# Patient Record
Sex: Male | Born: 1955 | Race: White | Hispanic: No | Marital: Married | Smoking: Never smoker
Health system: Southern US, Community
[De-identification: ages and names within clinical notes are randomized; demographics above are authoritative.]

## PROBLEM LIST (undated history)

## (undated) DIAGNOSIS — I10 Essential (primary) hypertension: Secondary | ICD-10-CM

---

## 2010-09-25 ENCOUNTER — Ambulatory Visit: Payer: Self-pay | Admitting: Family Medicine

## 2010-09-29 ENCOUNTER — Emergency Department: Payer: Self-pay | Admitting: Emergency Medicine

## 2012-01-07 LAB — FECAL OCCULT BLOOD, GUAIAC: Fecal Occult Blood: NEGATIVE

## 2012-03-03 IMAGING — CT CT STONE STUDY
1 of 2 series · 15 of 32 positions shown, 19 images · non-contrast
Comparison: none

REASON FOR EXAM: Right flank pain
COMMENTS:

[Series 2: stone · axial · 0.80mm/px · z∈[+60,+524]mm · 15 of 169 slices shown, 19 images]
[im 7/169  soft-tissue]
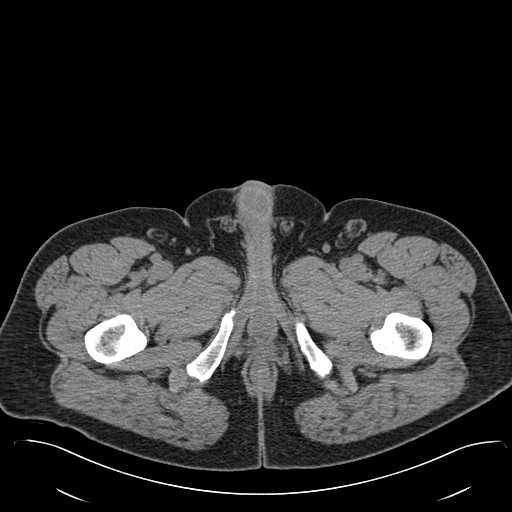
[im 7/169  bone]
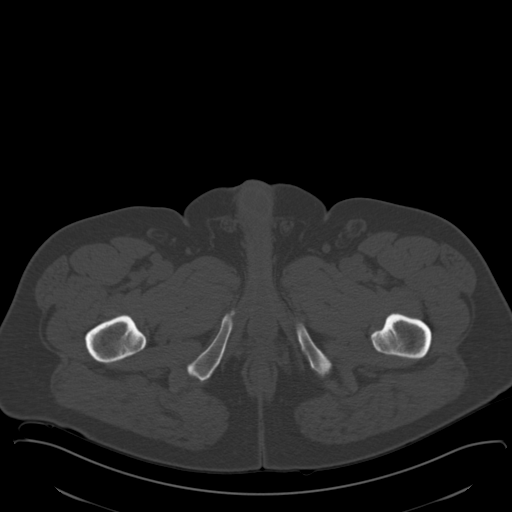
[im 20/169  soft-tissue]
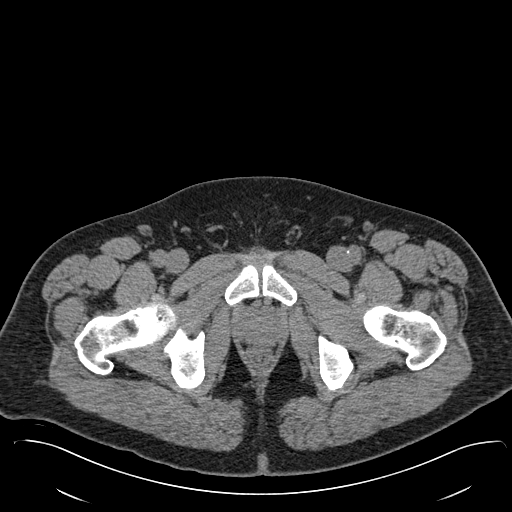
[im 33/169  soft-tissue]
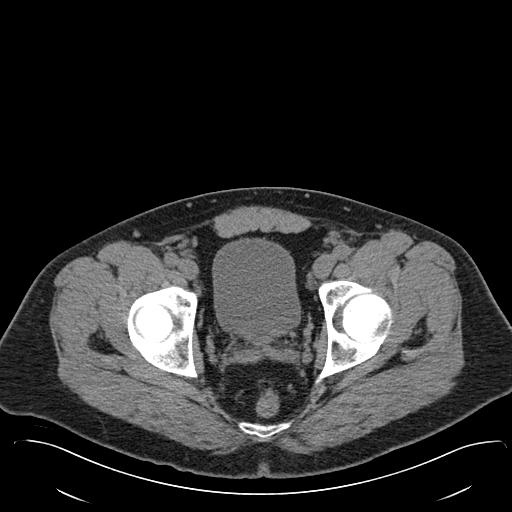
[im 46/169  soft-tissue]
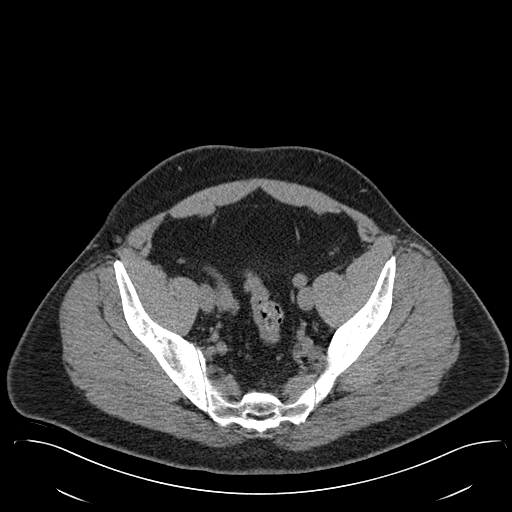
[im 59/169  soft-tissue]
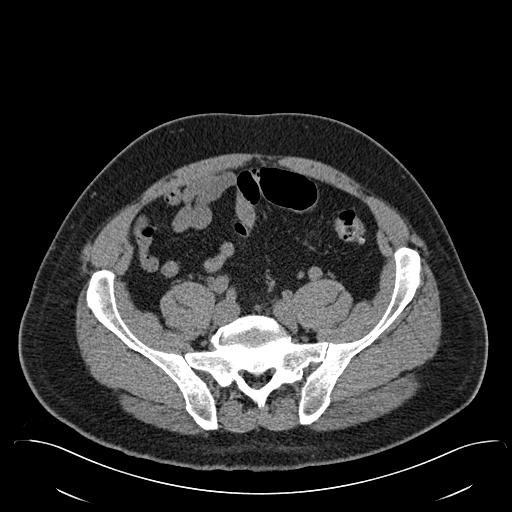
[im 72/169  soft-tissue]
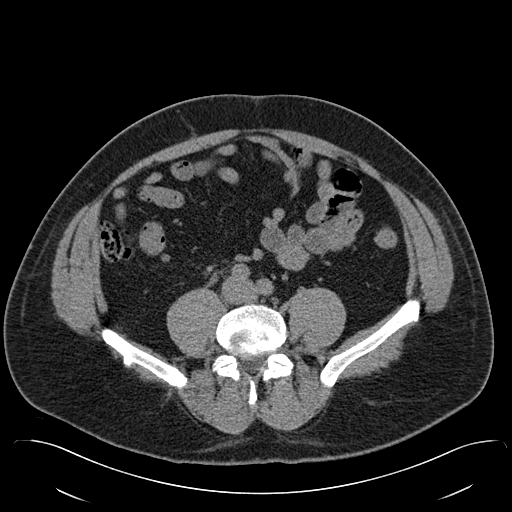
[im 85/169  soft-tissue]
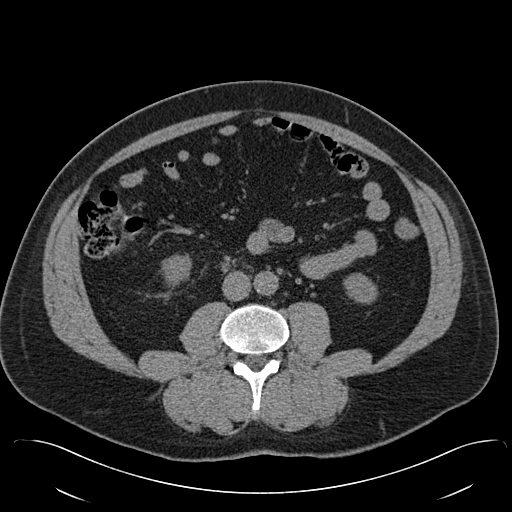
[im 97/169  soft-tissue]
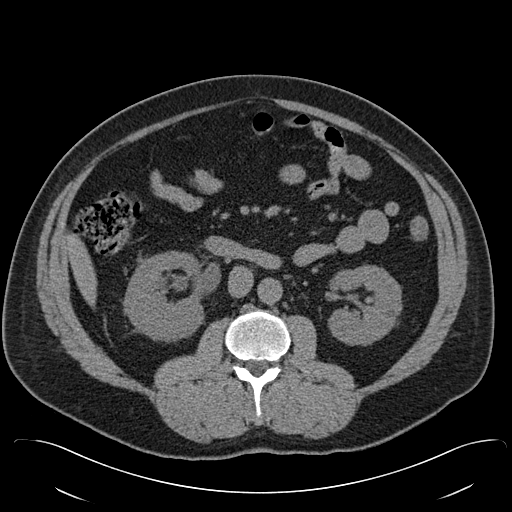
[im 110/169  soft-tissue]
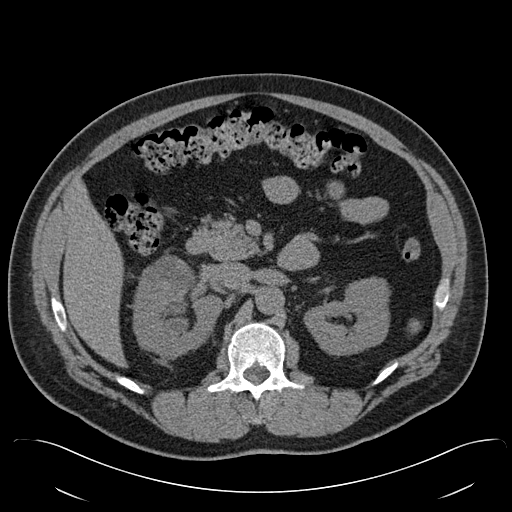
[im 110/169  bone]
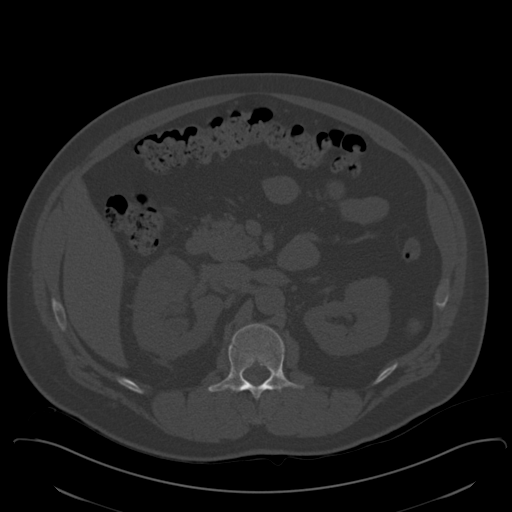
[im 123/169  soft-tissue]
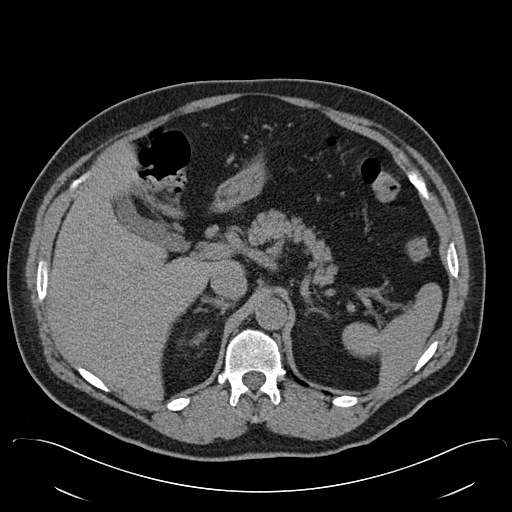
[im 136/169  soft-tissue]
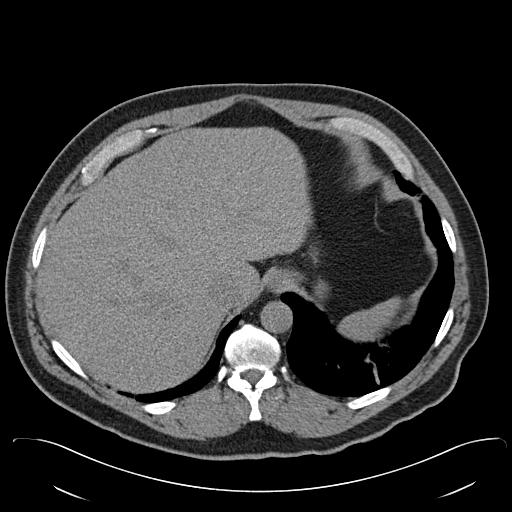
[im 143/169  lung]
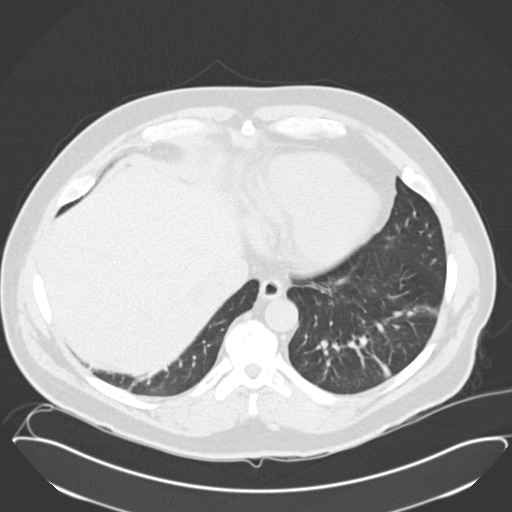
[im 149/169  soft-tissue]
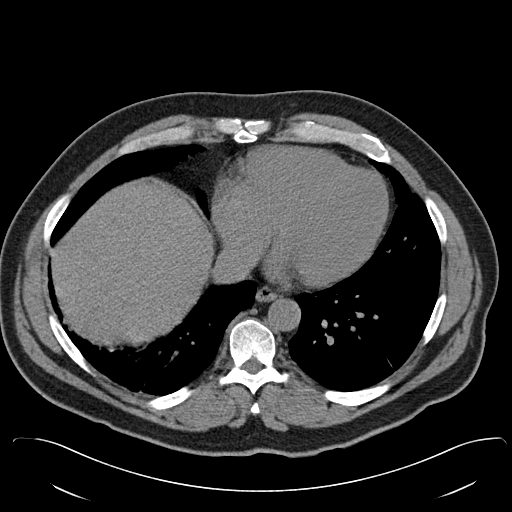
[im 149/169  lung]
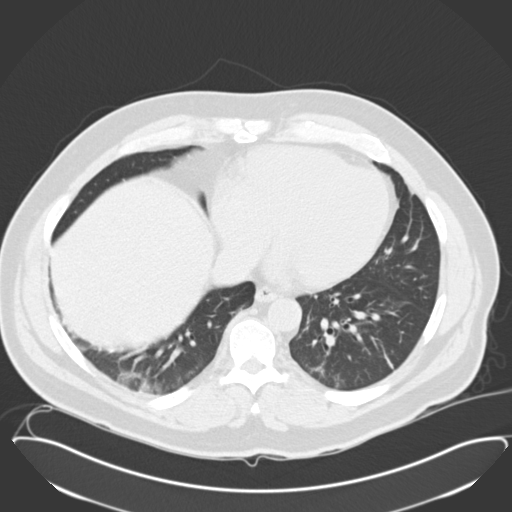
[im 156/169  lung]
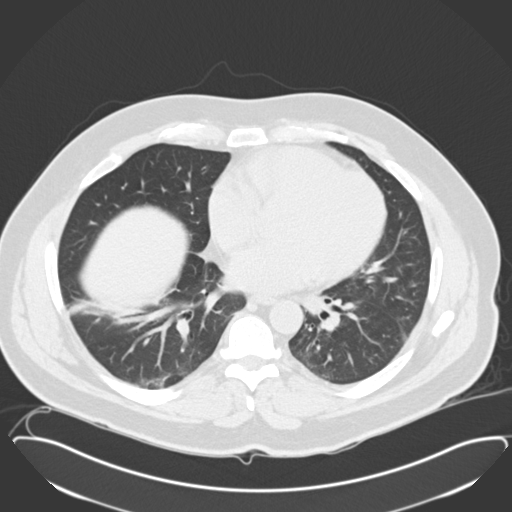
[im 162/169  soft-tissue]
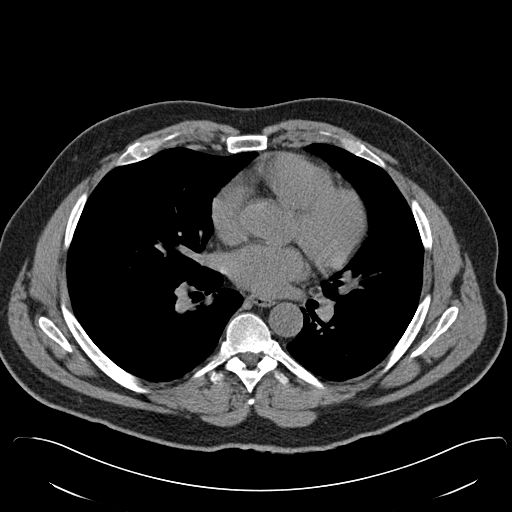
[im 162/169  lung]
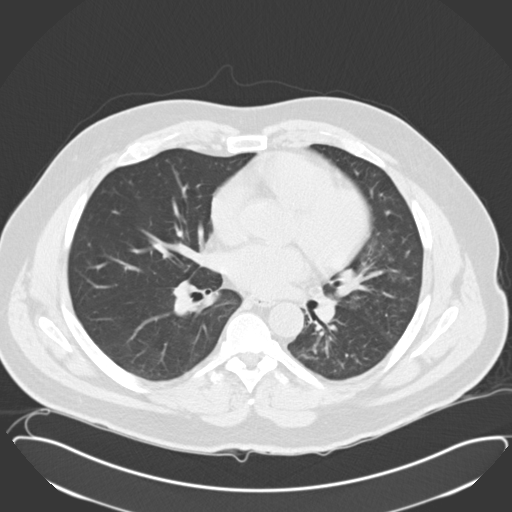

[15 of 32 positions shown; findings below may reference images not displayed]

PROCEDURE:     CT  - CT ABDOMEN /PELVIS WO (STONE)  - September 29, 2010  [DATE]

RESULT:     Axial noncontrast CT scanning was performed through the abdomen
and pelvis at 3 mm intervals and slice thicknesses. Review of multiplanar
reconstructed images was performed separately on the VIA monitor.

There is mild hydronephrosis and hydroureter on the right secondary to an
approximately 2 x 4 mm diameter stone just proximal to the right
ureterovesical junction on image 130. I see no urinary bladder stone. I see
no stones elsewhere in the right kidney nor stones on the left. There are
parapelvic and cortical cystic appearing structures in the right kidney.

The liver exhibits hypodensities in the right lobe most compatible with
cysts. The spleen, pancreas, nondistended stomach, gallbladder, and adrenal
glands are normal in appearance. The caliber of the abdominal aorta is
normal. The unopacified loops of small and large bowel exhibit no evidence
of ileus nor obstruction. The lung bases exhibit patchy increased density
suggesting subsegmental atelectasis versus scarring. The lumbar vertebral
bodies are preserved in height.
IMPRESSION: 1. On the right there is mild hydronephrosis and hydroureter secondary to a
2 x 4 mm diameter stone near the ureterovesical junction. I see no other
stones on the right. There is no obstruction of the left kidney. There are
likely parapelvic and cortical cysts in the right kidney.
2. I see no acute hepatobiliary abnormality nor acute bowel abnormality.

A preliminary report was sent to the [HOSPITAL] the conclusion
of the study.

## 2012-11-28 ENCOUNTER — Ambulatory Visit: Payer: Self-pay | Admitting: Family Medicine

## 2013-09-25 LAB — BASIC METABOLIC PANEL
BUN: 15 mg/dL (ref 4–21)
Creatinine: 0.9 mg/dL (ref 0.6–1.3)
Glucose: 88 mg/dL
Potassium: 3.9 mmol/L (ref 3.4–5.3)
SODIUM: 144 mmol/L (ref 137–147)

## 2013-09-25 LAB — HEPATIC FUNCTION PANEL
ALT: 23 U/L (ref 10–40)
AST: 22 U/L (ref 14–40)
Alkaline Phosphatase: 82 U/L (ref 25–125)
Bilirubin, Total: 0.4 mg/dL

## 2013-09-25 LAB — CBC AND DIFFERENTIAL
HCT: 45 % (ref 41–53)
Hemoglobin: 15.3 g/dL (ref 13.5–17.5)
Platelets: 283 10*3/uL (ref 150–399)
WBC: 6.9 10*3/mL

## 2013-09-25 LAB — TSH: TSH: 1.87 u[IU]/mL (ref 0.41–5.90)

## 2013-09-25 LAB — PSA: PSA: 1

## 2013-09-25 LAB — LIPID PANEL
Cholesterol: 178 mg/dL (ref 0–200)
HDL: 88 mg/dL — AB (ref 35–70)
LDL Cholesterol: 106 mg/dL
Triglycerides: 142 mg/dL (ref 40–160)

## 2013-09-26 ENCOUNTER — Ambulatory Visit: Payer: Self-pay | Admitting: Physician Assistant

## 2013-09-26 LAB — DOT URINE DIP
GLUCOSE, UR: NEGATIVE mg/dL (ref 0–75)
Protein: NEGATIVE
SPECIFIC GRAVITY: 1.01 (ref 1.003–1.030)

## 2014-05-03 IMAGING — CR DG KNEE COMPLETE 4+V*L*
1 series · 5 of 5 positions shown · non-contrast
Comparison: none

REASON FOR EXAM: Fax results [id number] knee pain
COMMENTS:

[Series 1: ap · 0.17mm/px · 5 of 5 slices shown]
[im 1/5]
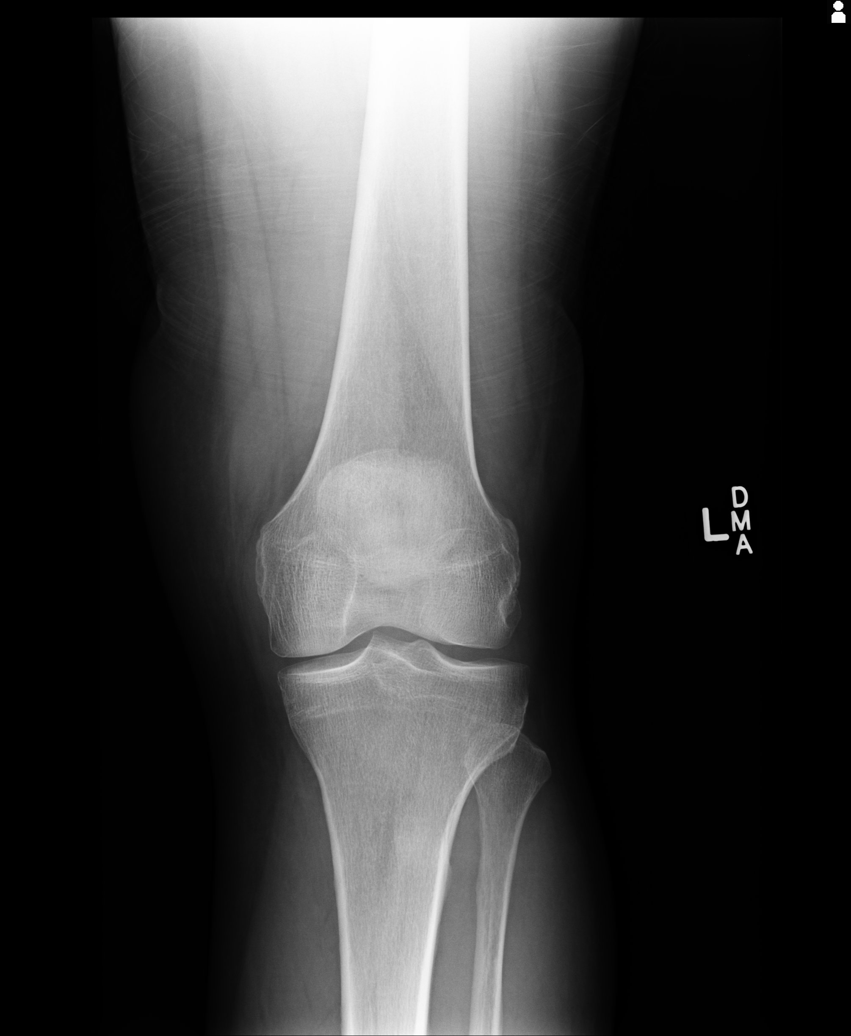
[im 2/5]
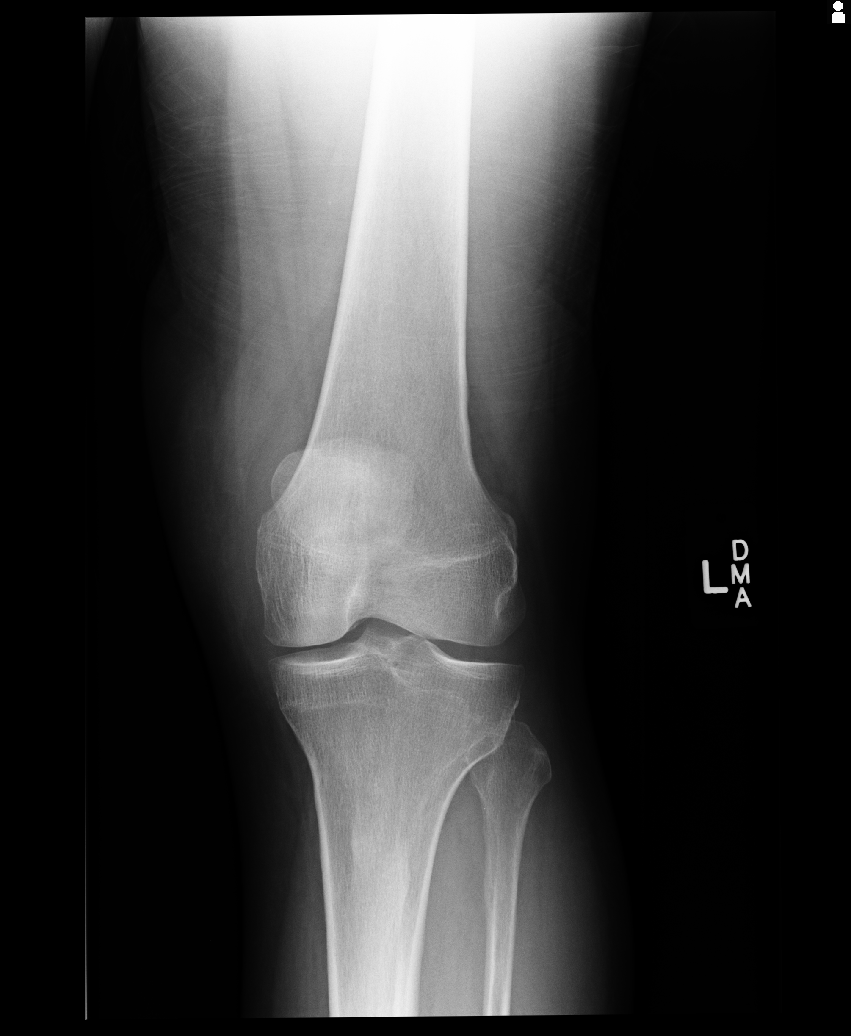
[im 3/5]
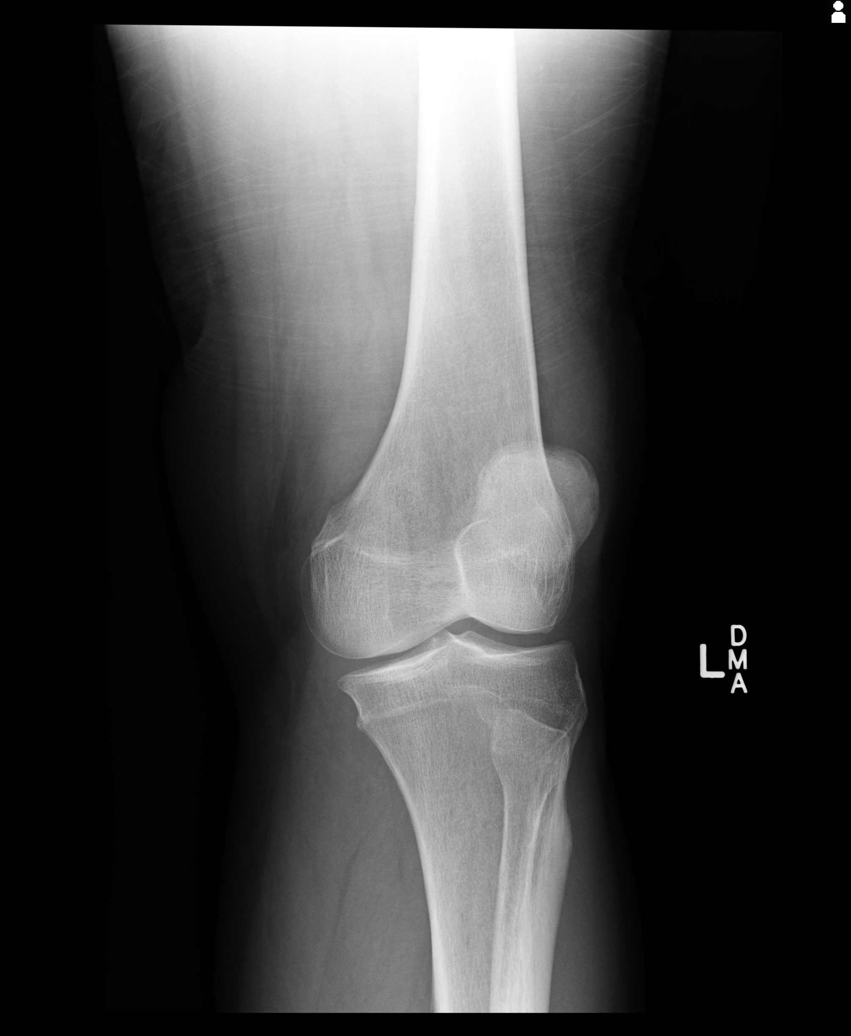
[im 4/5]
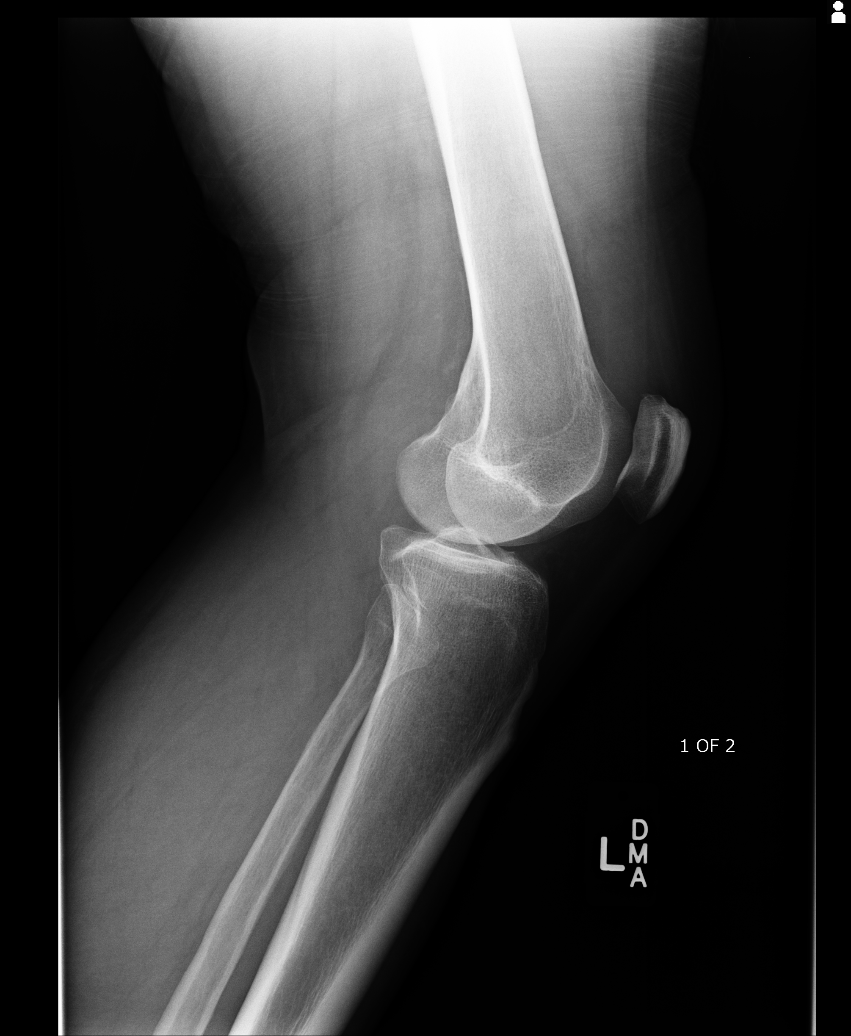
[im 5/5]
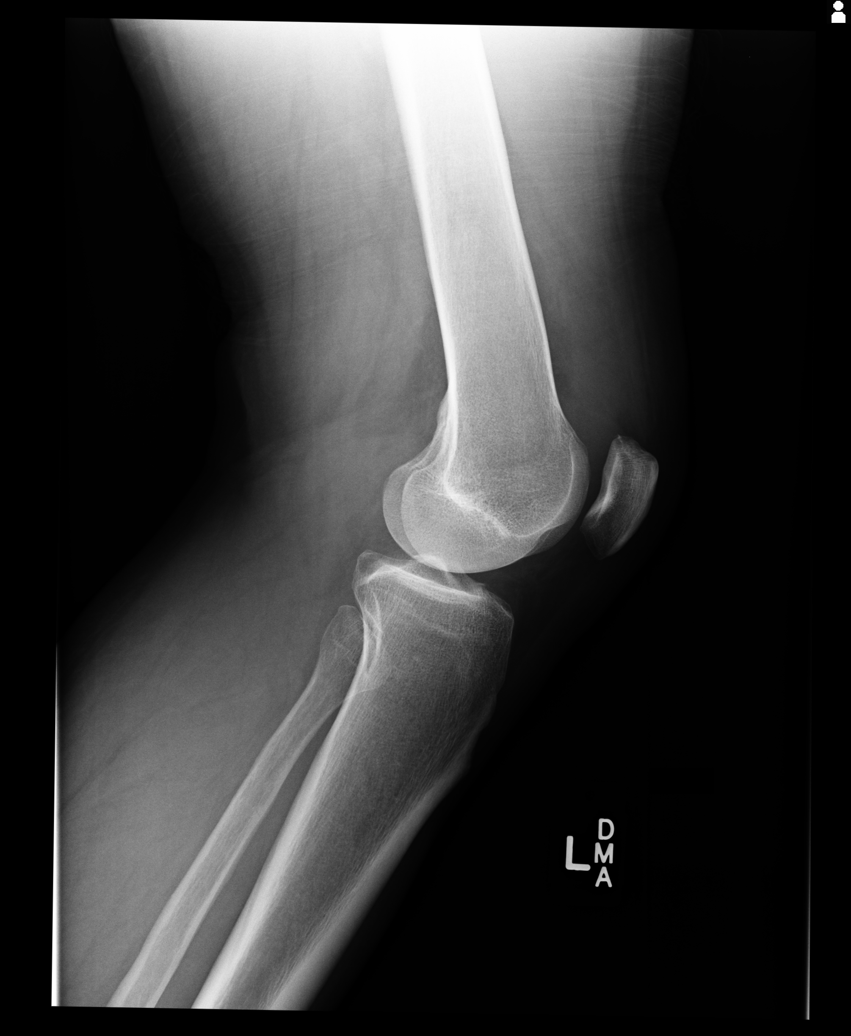

[5 of 5 positions shown; findings below may reference images not displayed]

PROCEDURE:     KDR - KDXR KNEE LT COMP WITH OBLIQUES  - November 28, 2012 [DATE]

RESULT:     Five views of the left knee reveal the bones to be adequately
mineralized. There is no evidence of an acute fracture. No significant
degenerative change is demonstrated. There is no evidence of a joint
effusion.
IMPRESSION: There is no acute bony abnormality of the left knee.

[REDACTED]

## 2014-08-23 DIAGNOSIS — E782 Mixed hyperlipidemia: Secondary | ICD-10-CM | POA: Insufficient documentation

## 2014-08-23 DIAGNOSIS — I10 Essential (primary) hypertension: Secondary | ICD-10-CM | POA: Insufficient documentation

## 2014-09-23 ENCOUNTER — Encounter: Payer: Self-pay | Admitting: Family Medicine

## 2014-09-23 ENCOUNTER — Ambulatory Visit (INDEPENDENT_AMBULATORY_CARE_PROVIDER_SITE_OTHER): Payer: BLUE CROSS/BLUE SHIELD | Admitting: Family Medicine

## 2014-09-23 VITALS — BP 140/80 | HR 62 | Temp 98.4°F | Resp 16 | Ht 69.0 in | Wt 232.4 lb

## 2014-09-23 DIAGNOSIS — Z0000 Encounter for general adult medical examination without abnormal findings: Secondary | ICD-10-CM

## 2014-09-23 DIAGNOSIS — I10 Essential (primary) hypertension: Secondary | ICD-10-CM

## 2014-09-23 DIAGNOSIS — E7800 Pure hypercholesterolemia, unspecified: Secondary | ICD-10-CM

## 2014-09-23 NOTE — Progress Notes (Signed)
Subjective:    Patient ID: Randy Ingram, male    DOB: Jan 25, 1956, 59 y.o.   MRN: [id number]  HPI Chief Complaint  Patient presents with  . Annual Exam   Allergies  Allergen Reactions  . Lidocaine     Other reaction(s): Dizzyness, Unconsciousness   History reviewed. No pertinent past medical history.  reports that he has never smoked. He does not have any smokeless tobacco history on file. He reports that he does not drink alcohol or use illicit drugs.    Family History  Problem Relation Age of Onset  . Cancer Father   . Hypertension Sister   . Stroke Brother   . Heart disease Maternal Grandfather   . Parkinson's disease Brother    Current Outpatient Prescriptions on File Prior to Visit  Medication Sig Dispense Refill  . amLODipine (NORVASC) 10 MG tablet Take 1 tablet by mouth daily.    Marland Kitchen aspirin 81 MG tablet 1 tablet daily.    . B COMPLEX VITAMINS PO 1 tablet daily.    . hydrochlorothiazide (HYDRODIURIL) 25 MG tablet Take 1 tablet by mouth daily.    Marland Kitchen losartan (COZAAR) 50 MG tablet Take 2 tablets by mouth daily.    . OMEGA-3 FATTY ACIDS PO Take 2 tablets by mouth daily.    . psyllium (REGULOID) 0.52 G capsule Take 10 capsules by mouth daily.     No current facility-administered medications on file prior to visit.   History reviewed. No pertinent past surgical history. History of renal stones - spontaneously passed 2004 and 2012.  Past Medical History  Diagnosis Date  . Hypertension       Review of Systems  Constitutional: Negative.   HENT: Negative.   Eyes: Negative.   Respiratory: Negative.   Cardiovascular: Negative.   Gastrointestinal: Negative.   Genitourinary: Negative.   Musculoskeletal: Negative.   Skin: Negative.   Neurological: Negative.   Hematological: Negative.        Objective:   Physical Exam  Constitutional: He is oriented to person, place, and time. He appears well-developed and well-nourished.  HENT:  Head: Normocephalic.   Right Ear: External ear normal.  Left Ear: External ear normal.  Nose: Nose normal.  Mouth/Throat: Oropharynx is clear and moist.  Eyes: Conjunctivae and EOM are normal. Pupils are equal, round, and reactive to light.  Neck: Normal range of motion. Neck supple.  Cardiovascular: Normal rate, regular rhythm, normal heart sounds and intact distal pulses.   Pulmonary/Chest: Effort normal and breath sounds normal.  Abdominal: Soft.  Genitourinary: Rectum normal, prostate normal and penis normal. Guaiac negative stool.  Musculoskeletal: Normal range of motion.  Neurological: He is alert and oriented to person, place, and time. He has normal reflexes.  Skin: Skin is warm and dry.  Psychiatric: He has a normal mood and affect. His behavior is normal.   BP 148/84 mmHg  Pulse 62  Temp(Src) 98.4 F (36.9 C) (Oral)  Resp 16  Ht 5\' 9"  (1.753 m)  Wt 232 lb 6.4 oz (105.416 kg)  BMI 34.30 kg/m2   BP: 140/80 mmHg        Assessment & Plan:  1. Annual physical exam Good general health. Tdap and pneumonia vaccinations up to date. States he was given Shingles vaccination in the past couple years and he will have the [facility] send documentation. Given anticipatory guidance appropriate for age. Recommend scheduling screening colonoscopy.   - PSA  2. Essential hypertension Tolerating Losartan, HCTZ and Amlodipine without side  effects. BP reported as 130-120/80-90 at home. Always elevates in a medical office. Today's reading in normal range. Continue present regimen. EKG wnl. Will recheck routine labs. - EKG 12-Lead EKG: normal EKG, normal sinus rhythm, unchanged from previous tracings, normal sinus rhythm.  - CBC with Differential - COMPLETE METABOLIC PANEL WITH GFR  3. Pure hypercholesterolemia Trying to follow low fat diet. Still needs to lose weight. Taking Omega-3 supplement and Psyllium to help lower cholesterol. Will recheck labs to assess progress. Recheck pending report. - COMPLETE  METABOLIC PANEL WITH GFR - TSH - Lipid Profile

## 2014-09-25 LAB — CBC WITH DIFFERENTIAL/PLATELET
BASOS ABS: 0 10*3/uL (ref 0.0–0.2)
BASOS: 1 %
EOS (ABSOLUTE): 0.2 10*3/uL (ref 0.0–0.4)
Eos: 2 %
HEMATOCRIT: 44.9 % (ref 37.5–51.0)
HEMOGLOBIN: 15.5 g/dL (ref 12.6–17.7)
Immature Grans (Abs): 0 10*3/uL (ref 0.0–0.1)
Immature Granulocytes: 0 %
Lymphocytes Absolute: 2.3 10*3/uL (ref 0.7–3.1)
Lymphs: 29 %
MCH: 30.5 pg (ref 26.6–33.0)
MCHC: 34.5 g/dL (ref 31.5–35.7)
MCV: 88 fL (ref 79–97)
MONOCYTES: 6 %
MONOS ABS: 0.5 10*3/uL (ref 0.1–0.9)
Neutrophils Absolute: 5 10*3/uL (ref 1.4–7.0)
Neutrophils: 62 %
Platelets: 308 10*3/uL (ref 150–379)
RBC: 5.09 x10E6/uL (ref 4.14–5.80)
RDW: 13.7 % (ref 12.3–15.4)
WBC: 7.9 10*3/uL (ref 3.4–10.8)

## 2014-09-25 LAB — PSA: Prostate Specific Ag, Serum: 1.2 ng/mL (ref 0.0–4.0)

## 2014-09-25 LAB — LIPID PANEL
CHOLESTEROL TOTAL: 188 mg/dL (ref 100–199)
Chol/HDL Ratio: 4.2 ratio units (ref 0.0–5.0)
HDL: 45 mg/dL (ref >39)
LDL Calculated: 112 mg/dL — ABNORMAL HIGH (ref 0–99)
TRIGLYCERIDES: 155 mg/dL — AB (ref 0–149)
VLDL Cholesterol Cal: 31 mg/dL (ref 5–40)

## 2014-09-25 LAB — TSH: TSH: 2.48 u[IU]/mL (ref 0.450–4.500)

## 2014-09-26 ENCOUNTER — Telehealth: Payer: Self-pay

## 2014-09-26 ENCOUNTER — Ambulatory Visit
Admission: EM | Admit: 2014-09-26 | Discharge: 2014-09-26 | Disposition: A | Discharge status: Home / Self Care | Payer: PRIVATE HEALTH INSURANCE | Attending: Family Medicine | Admitting: Family Medicine

## 2014-09-26 DIAGNOSIS — Z0289 Encounter for other administrative examinations: Secondary | ICD-10-CM

## 2014-09-26 HISTORY — DX: Essential (primary) hypertension: I10

## 2014-09-26 LAB — DEPT OF TRANSP DIPSTICK, URINE (ARMC ONLY)
Glucose, UA: NEGATIVE mg/dL
HGB URINE DIPSTICK: NEGATIVE
Protein, ur: NEGATIVE mg/dL
Specific Gravity, Urine: 1.02 (ref 1.005–1.030)

## 2014-09-26 NOTE — Telephone Encounter (Signed)
Attempted to contact patient. No answer nor voicemail.  

## 2014-09-26 NOTE — Telephone Encounter (Signed)
Patient advised as directed below. Patient verbalized understanding.  

## 2014-09-26 NOTE — ED Provider Notes (Signed)
[facility] Emergency Department Provider Note  ____________________________________________  Time seen: Approximately 11:30 AM  I have reviewed the triage vital signs and the nursing notes.   HISTORY  Chief Complaint Employment Physical    HPI Randy Ingram is a 59 y.o. male presents for DOT physical. See scanned documents for complete details.   Past Medical History  Diagnosis Date  . Hypertension     Patient Active Problem List   Diagnosis Date Noted  . 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 BP (high blood pressure) 08/23/2014  . Essential (primary) hypertension 06/27/2007    History reviewed. No pertinent past surgical history.  Current Outpatient Rx  Name  Route  Sig  Dispense  Refill  . amLODipine (NORVASC) 10 MG tablet   Oral   Take 1 tablet by mouth daily.         . APPLE CIDER VINEGAR PO   Oral   Take by mouth daily. 2 tablespoons daily         . aspirin 81 MG tablet      1 tablet daily.         . B COMPLEX VITAMINS PO      1 tablet daily.         . hydrochlorothiazide (HYDRODIURIL) 25 MG tablet   Oral   Take 1 tablet by mouth daily.         Marland Kitchen losartan (COZAAR) 50 MG tablet   Oral   Take 2 tablets by mouth daily.         . OMEGA-3 FATTY ACIDS PO   Oral   Take 2 tablets by mouth daily.         . psyllium (REGULOID) 0.52 G capsule   Oral   Take 10 capsules by mouth daily.           Allergies Lidocaine  Family History  Problem Relation Age of Onset  . Cancer Father   . Hypertension Sister   . Stroke Brother   . Heart disease Maternal Grandfather   . Parkinson's disease Brother     Social History History  Substance Use Topics  . Smoking status: Never Smoker   . Smokeless tobacco: Not on file  . Alcohol Use: No    Review of Systems Constitutional: No fever/chills Eyes: No visual changes. ENT: No sore throat. Cardiovascular: Denies chest  pain. Respiratory: Denies shortness of breath. Gastrointestinal: No abdominal pain.  No nausea, no vomiting.  No diarrhea.  No constipation. Genitourinary: Negative for dysuria. Musculoskeletal: Negative for back pain. Skin: Negative for rash. Neurological: Negative for headaches, focal weakness or numbness.  10-point ROS otherwise negative.  ____________________________________________   PHYSICAL EXAM:  VITAL SIGNS: ED Triage Vitals  Enc Vitals Group     BP 09/26/14 1111 168/82 mmHg     Pulse Rate 09/26/14 1110 70     Resp 09/26/14 1110 16     Temp 09/26/14 1110 98.6 F (37 C)     Temp Source 09/26/14 1110 Oral     SpO2 09/26/14 1110 98 %     Weight 09/26/14 1111 230 lb (104.327 kg)     Height 09/26/14 1111 5\' 9"  (1.753 m)     Head Cir --      Peak Flow --      Pain Score --      Pain Loc --      Pain Edu? --      Excl. in [facility]? --  Constitutional: Alert and oriented. Well appearing and in no acute distress. Eyes: Conjunctivae are normal. PERRL. EOMI. Head: Atraumatic. Nose: No congestion/rhinnorhea. Mouth/Throat: Mucous membranes are moist.  Oropharynx non-erythematous. Neck: No stridor.   Cardiovascular: Normal rate, regular rhythm. Grossly normal heart sounds.  Good peripheral circulation. Respiratory: Normal respiratory effort.  No retractions. Lungs CTAB. Gastrointestinal: Soft and nontender. No distention. No abdominal bruits. No CVA tenderness. Musculoskeletal: No lower extremity tenderness nor edema.  No joint effusions. Neurologic:  Normal speech and language. No gross focal neurologic deficits are appreciated. Speech is normal. No gait instability. Skin:  Skin is warm, dry and intact. No rash noted. Psychiatric: Mood and affect are normal. Speech and behavior are normal.  ____________________________________________   LABS (all labs ordered are listed, but only abnormal results are displayed)  Labs Reviewed  DEPT OF TRANSP DIPSTICK, URINE([facility]  ONLY)   ____________________________________________  EKG  Deferred ____________________________________________  RADIOLOGY  Deferred ____________________________________________   PROCEDURES  Procedure(s) performed: None  Critical Care performed: No  ____________________________________________   INITIAL IMPRESSION / ASSESSMENT AND PLAN / ED COURSE  Pertinent labs & imaging results that were available during my care of the patient were reviewed by me and considered in my medical decision making (see chart for details).  DOT physical. ____________________________________________   FINAL CLINICAL IMPRESSION(S) / ED DIAGNOSES  Final diagnoses:  Encounter for examination required by Department of Transportation (DOT)      Arlyss Repress, PA-C 09/26/14 1301

## 2014-09-26 NOTE — Telephone Encounter (Signed)
-----   Message from Margo Common, [place] sent at 09/26/2014  8:26 AM EDT ----- Blood tests essentially normal except triglycerides and LDL cholesterol slightly up. Continue present regimen of Psyllium and Omega-3 Fish Oil with low fat diet. Losing 5-10 lbs would also help. Recheck appointment in 6 months to check on progress.

## 2014-09-26 NOTE — ED Notes (Signed)
For DOT Physical.

## 2014-12-05 ENCOUNTER — Telehealth: Payer: Self-pay | Admitting: Family Medicine

## 2014-12-05 DIAGNOSIS — Z1211 Encounter for screening for malignant neoplasm of colon: Secondary | ICD-10-CM

## 2014-12-05 NOTE — Telephone Encounter (Signed)
Pt states she was here in June and was told he needed a colonoscopy but has not had a call back with an appt.  CB#[phone]/MW

## 2014-12-05 NOTE — Telephone Encounter (Signed)
Schedule for screening colonoscopy.

## 2014-12-05 NOTE — Telephone Encounter (Signed)
Please advise 

## 2014-12-09 ENCOUNTER — Telehealth: Payer: Self-pay

## 2014-12-09 DIAGNOSIS — I10 Essential (primary) hypertension: Secondary | ICD-10-CM

## 2014-12-09 MED ORDER — LOSARTAN POTASSIUM 100 MG PO TABS: 100.0000 mg | ORAL_TABLET | Freq: Every day | ORAL | Status: DC

## 2014-12-09 NOTE — Telephone Encounter (Signed)
Randy Ingram patient--- Refill requested received from [facility] requesting Losartan 100 mg. Per pharmacy patient was taking 2 50 mg tabs per day, but it is no longer covered by insurance for 2 tablets per day. Please authorize change to 100 mg tablets

## 2014-12-09 NOTE — Telephone Encounter (Signed)
[facility]  Advanced Heart Failure and Transplant  Heart Transplant Clinic  Follow-up Visit    Primary Care Physician: Brodsky, Mark E  Referring Provider: Brett Justin Berman  Date of Transplant: 06/08/2019  Organ(s) Transplanted: heart  Indication for transplant: Dilated Myopathy: Idiopathic  PHS increased risk donor: Yes    ID. 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    Interval History:    The patient was last seen on 08/03/21. At that time issues with pain after urologic procedure.    He continues to deal with pain issue largely from prostate surgery. He still has some bleeding and some tissue come out. He tried different strategies and nothing helped. This is really impacting quality of life. He gets tired and frustrated and does not want to take it out on his family.    ROS:  A complete ROS was performed and is negative except as documented in the HPI.      Allergies:  Patient is allergic to cats [other] and dogs [other].    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    Pericardial effusion    Hypertension    Chronic back pain    At risk for infection transmitted from donor    Acute hepatitis C virus infection    Heart transplanted (CMS-HCC)    Acute UTI    Umbilical hernia without obstruction and without gangrene    COVID-19 virus detected    Acute medial meniscus tear of left knee, sequela    Localized osteoarthritis of left knee     Past Surgical History:   Procedure Laterality Date    CARDIAC DEFIBRILLATOR PLACEMENT       PB ANESTH,SHOULDER JOINT,NOS Right      Family History   Problem Relation Name Age of Onset    Hypertension Other      Other Maternal Grandmother          kidney disease needing HD     Social History     Socioeconomic History    Marital status: Single     Spouse name: Not on file    Number of children: Not on file    Years of education: Not on file    Highest education level: Not on file   Occupational History    Not on file   Tobacco Use    Smoking status: Never    Smokeless tobacco: Never    Tobacco comments:     from friends and relatives    Substance and Sexual Activity    Alcohol use: Not Currently     Comment: Prior heavier use, but completely quit in 2016    Drug use: Yes     Comment: eats edible marijuana for pain and insomnia     Sexual activity: Not on file   Other Topics Concern    Not on file   Social History Narrative      Born in [place], also lived in [place], [place], [place], no travel, worked as a carpenter, occasional cedar, no birds, no hot tubs, worked in construction + possible asbestos exposure      Social Determinants of Health     Financial Resource Strain: Not on file   Food Insecurity: Not on file   Transportation Needs: Not on file   Physical Activity: Not on file   Stress: Not on file   Social Connections: Not on file   Intimate Partner Violence: Not on file   Housing Stability: Not on file     Current Outpatient Medications   Medication Sig    albuterol 108 (90 Base) MCG/ACT inhaler Inhale 2 puffs by mouth every 4 hours as needed for Wheezing or Shortness of Breath.    aspirin 81 MG EC tablet Take 1 tablet (81 mg) by mouth daily.    baclofen (LIORESAL) 10 MG tablet Take 2 tablets (20 mg) by mouth nightly.    Blood Glucose Monitoring Suppl (TRUE METRIX METER) w/Device KIT Use as directed    budesonide-formoterol (SYMBICORT) 160-4.5 MCG/ACT inhaler Inhale 2 puffs by mouth every 12 hours.    bumetanide (BUMEX) 1 MG tablet Take 1 tablet (1 mg) by mouth daily as needed (fluid/weight  gain). Do not take unless instructed by Transplant team.    Calcium Carb-Cholecalciferol 600-10 MG-MCG TABS Take 1 tablet by mouth 2 times daily.    Cetirizine HCl (ZERVIATE) 0.24 % SOLN Place 1 drop into both eyes 2 times daily.    clindamycin (CLEOCIN T) 1 % solution Apply 1 Application. topically 2 times daily. Apply to the red bumps on your face up to two times a day.    controlled substance agreement controlled substance agreement    diclofenac (VOLTAREN) 1 % gel Apply 2 g topically 4 times daily.    docusate sodium (COLACE) 100 MG capsule Take 1 capsule (100 mg) by mouth 2 times daily.    DULoxetine (CYMBALTA) 30 MG CR capsule Take 1 capsule (30 mg) by mouth daily.    famotidine (PEPCID) 20 MG tablet Take 1 tablet (20 mg) by mouth 2 times daily.    fluticasone propionate (FLONASE) 50 MCG/ACT nasal spray Spray 1 spray into each nostril 2 times daily.    gabapentin (NEURONTIN) 300 MG capsule Take 1 capsule (300 mg) by mouth every morning AND 1 capsule (300 mg) daily AND 2 capsules (600 mg) every evening.    hydroCHLOROthiazide (HYDRODIURIL) 25 MG tablet Take 1 tablet (25 mg) by mouth daily.    ketoconazole (NIZORAL) 2 % shampoo Use shampoo daily for dandruff    lidocaine (LIDOCAINE PAIN RELIEF) 4 % patch Apply 1 patch topically every 24 hours. Leave patch on for 12 hours, then remove for 12 hours.    lisinopril (PRINIVIL, ZESTRIL) 10 MG tablet Take 2 tablets (20 mg) by mouth daily.    magnesium oxide (MAG-OX) 400 MG tablet Take 1 tablet by mouth daily    melatonin (GNP MELATONIN MAXIMUM STRENGTH) 5 MG tablet Take 2 tablets (10 mg) by mouth at bedtime.    Multiple Vitamin (MULTIVITAMIN) TABS tablet Take 1 tablet by mouth daily.    naloxone (KLOXXADO) 8 mg/0.1 mL nasal spray Call 911! Tilt head and spray intranasally into one nostril as needed for respiratory depression. If patient does not respond or responds and then relapses, repeat using a new nasal spray every 3 minutes until emergency medical assistance  arrives.    NEEDLE, DISP, 25 G 25G X   1" MISC Use to inject testosterone    NIFEdipine (ADALAT CC) 30 MG Controlled-Release tablet Take 1 tablet (30 mg) by mouth nightly.    ondansetron (ZOFRAN) 8 MG tablet Take 1 tablet (8 mg) by mouth every 8 hours as needed for Nausea/Vomiting.    oxyCODONE (ROXICODONE) 10 MG tablet Take 1 tab every 4 hours as needed for moderate pain and 2 tabs every 4 hours as needed for severe pain. Max 10 tabs per day, 28 day supply    phenazopyridine (PYRIDIUM) 100 MG tablet Take 1 tablet (100 mg) by mouth 3 times daily.    polyethylene glycol (GLYCOLAX) 17 GM/SCOOP powder Mix 17 grams in 4-8 oz of liquide and drink by mouth daily as needed (Constipation).    pravastatin (PRAVACHOL) 40 MG tablet Take 1 tablet (40 mg) by mouth every evening.    senna (SENOKOT) 8.6 MG tablet Take 1 tablet (8.6 mg) by mouth daily.    sirolimus (RAPAMUNE) 1 MG tablet Take 2 tablets (2 mg) by mouth every morning.    SYRINGE-NEEDLE, DISP, 3 ML (B-D 3CC LUER-LOK SYR 25GX1") 25G X 1" 3 ML MISC Use as directed to inject testosterone    SYRINGE-NEEDLE, DISP, 3 ML 18G X 1-1/2" 3 ML MISC Use to draw up testosterone    tacrolimus (ENVARSUS XR) 1 MG tablet STOP TAKING since 12/30/21 - remaining on chart for dose adjustments, titratable med.    tacrolimus (ENVARSUS XR) 4 MG tablet Take 1 tablet (4 mg) by mouth every morning.    tamsulosin (FLOMAX) 0.4 MG capsule Take 1 capsule (0.4 mg) by mouth daily.    tamsulosin (FLOMAX) 0.4 MG capsule Take 1 capsule (0.4 mg) by mouth daily.    testosterone cypionate (DEPO-TESTOSTERONE) 200 MG/ML SOLN Inject 1 ml into the muscle every 14 days    traZODone (DESYREL) 50 MG tablet Take 1 tablet (50 mg) by mouth nightly.     Current Facility-Administered Medications   Medication    diphenhydrAMINE (BENADRYL) injection 50 mg    diphenhydrAMINE (BENADRYL) tablet 50 mg     Immunization History   Administered Date(s) Administered    COVID-19 (Moderna) Low Dose Red Cap >= 18 Years 05/30/2020     COVID-19 (Moderna) Red Cap >= 12 Years 07/07/2019, 08/06/2019, 12/05/2019    Hep-A/Hep-B; Twinrix, Adult 06/30/2020    Influenza Vaccine (High Dose) Quadrivalent >=65 Years 02/20/2020    Influenza Vaccine (Unspecified) 12/18/2016    Influenza Vaccine >=6 Months 03/02/2010, 03/02/2011, 04/27/2012, 01/23/2014, 01/06/2018, 01/22/2019    Pneumococcal 13 Vaccine (PREVNAR-13) 02/20/2020    Pneumococcal 23 Vaccine (PNEUMOVAX-23) 03/02/2013, 06/30/2020    Tdap 04/20/2011   Deferred Date(s) Deferred    Pneumococcal 23 Vaccine (PNEUMOVAX-23) 06/21/2019     Physical Exam:  BP 102/69 (BP Location: Right arm, BP Patient Position: Sitting, BP cuff size: Large)   Pulse 98   Temp 98.5 F (36.9 C) (Temporal)   Resp 16   Ht 5' 10" (1.778 m)   Wt 96.2 kg (212 lb)   SpO2 97%   BMI 30.42 kg/m      General Appearance: ***alert, no distress, pleasant affect, cooperative.  Heart:  JVD ***, PMI ***, normal rate and regular rhythm, no murmurs, clicks, or gallops. ***  Lungs: ***clear to auscultation and percussion. No rales, rhonchi, or wheezes noted. No chest deformities noted.  Abdomen: ***BS normal.  Abdomen soft, non-tender.  No masses or organomegaly.  Extremities:  ***no cyanosis, clubbing, or edema. Has 2+ peripheral pulses.        Lab Data:  Lab Results   Component Value Date    BUN 26 (H) 12/30/2021    CREAT 1.98 (H) 12/30/2021    CL 99 12/30/2021    NA 140 12/30/2021    K 4.4 12/30/2021    CA 9.2 12/30/2021    TBILI 0.47 12/30/2021    ALB 4.1 12/30/2021    TP 7.1 12/30/2021    AST 22 12/30/2021    ALK 76 12/30/2021    BICARB 29 12/30/2021    ALT 25 12/30/2021    GLU 126 (H) 12/30/2021     Lab Results   Component Value Date    WBC 7.9 12/30/2021    RBC 5.50 12/30/2021    HGB 15.2 12/30/2021    HCT 46.5 12/30/2021    MCV 84.5 12/30/2021    MCHC 32.7 12/30/2021    RDW 12.3 12/30/2021    PLT 162 12/30/2021    MPV 11.6 12/30/2021     Lab Results   Component Value Date    A1C 5.7 05/29/2021     Lab Results   Component Value Date     TSH 1.63 05/29/2021     Lab Results   Component Value Date    CHOL 105 05/29/2021    HDL 38 05/29/2021    LDLCALC 43 05/29/2021    TRIG 121 05/29/2021     Lab Results   Component Value Date    SIROT 11.5 12/30/2021     Lab Results   Component Value Date    FKTR 6.5 12/30/2021     No results found for: CSATR  Lab Results   Component Value Date    CMVPL Not Detected 03/20/2021     Lab Results   Component Value Date    DSA ABSENT 12/30/2021       Prior Cardiovascular Studies:   Lab Results   Component Value Date    LV Ejection Fraction 59 06/25/2021          Echo 06/25/21  Summary:   1. The left ventricular size is normal. The left ventricular systolic function is normal.   2. No left ventricular hypertrophy.   3. Normal pattern of left ventricular diastolic filling.   4. EF=59%.   5. Compared to prior study EF now 59%, was 69% 07/18/20.     LHC/IVUS 06/23/21  CONCLUSION:                                                                   1. Myocardial bridging with mild systolic compression of the mid segment    of the left anterior descending coronary artery.                              2. No angiographic evidence of coronary artery disease.                      3. Intimal thickness noted in LAD/LM up to 0.5 mm (Stable to slightly       worse compare to 2022).                                                         4. Non significant FFR at apical LAD.                                        5. Left ventricular end diastolic pressure appears normal.        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    Assessment/Plan:  # Hematuria  # Dysuria  # Chronic pain  Assessment: We had a long frank discussion about patient's chronic pain issues and the heart transplant team's role in this. I discussed with him that when I initially agreed to cover his chronic opiate prescription, this was the assumption that he would  have a provider versed in chronic pain after 3-4 months, but we are at 6 months and has unable to find one. Additionally, I had not put him on a pain contract at that time, but he recently used more opiates without asking and I informed him this was not appropriate, but because he had not established guidelines I was not going to stop at this time. However, going forward until he can establish with a pain physician, we will set up a pain contract and he will need to follow through like a usual pain clinic with us with goal of provider in 3-4 months or I may start tapering. I will augment adjuvant agents additionally for now and we can continue to work on this.  Plan:  -pain contract signed  -urine tox monthly  -clinic follow up month  -oxycodone 10 mg tablets PO, 1 tab every 4 hours moderate pain, 2 tabs every 4 hours for severe pain, no more than 10 tablets a day, total 280 per 28 days.   -diclofenac cream for joint pain  -lidocaine patch for back pain  -trial of pyridium  -increase gaba at night  -siro change as below  -cymbalta as below    # End-stage heart failure s/p orthotopic heart transplant  # Chronic Immunosuppression/Immunomodulation  Assessment: While we thought continuing sirolimus would help prevent recurrent scar tissue from prostate procedure, it may be exacerbating factors now with delayed wound healing. Will try mmf for 1 month.  Plan:   - continue envarsus 6 mg daily, goal trough 4-8  - HOLD sirolimus 3 mg daily, goal trough 4-8 for at least 1 month  - start mmf 1000 mg bid for one month to allow healing  - Continue to monitor for renal toxicities, infection risk and malignancy risk  - continue pravastatin 40 mg daily  - continue aspirin 81 mg daily    # Hypertension  Assessment: controlled  Plan:  -continue lisinopril 20 mg daily  -resume hctz  -nifedipine 30 mg daily    # Dyslipidemia  -continue pravastatin 40 mg daily    # Depression  Assessment: improved mood  Plan:  -increase cymbalta to 120  mg daily     RTC in 1 month       Nicholas W Wettersten, MD  Advanced Heart Failure, Mechanical Circulatory Support, Transplant  Pgr: [phone]

## 2014-12-13 ENCOUNTER — Telehealth: Payer: Self-pay | Admitting: Gastroenterology

## 2014-12-13 ENCOUNTER — Other Ambulatory Visit: Payer: Self-pay

## 2014-12-13 NOTE — Telephone Encounter (Signed)
Gastroenterology Pre-Procedure Review  Request Date: 01-17-2015 Requesting Physician: Dr.   PATIENT REVIEW QUESTIONS: The patient responded to the following health history questions as indicated:    1. Are you having any GI issues? no 2. Do you have a personal history of Polyps? no 3. Do you have a family history of Colon Cancer or Polyps? no 4. Diabetes Mellitus? no 5. Joint replacements in the past 12 months?no 6. Major health problems in the past 3 months?no 7. Any artificial heart valves, MVP, or defibrillator?no    MEDICATIONS & ALLERGIES:    Patient reports the following regarding taking any anticoagulation/antiplatelet therapy:   Plavix, Coumadin, Eliquis, Xarelto, Lovenox, Pradaxa, Brilinta, or Effient? no Aspirin? yes (DAily )  Patient confirms/reports the following medications:  Current Outpatient Prescriptions  Medication Sig Dispense Refill   amLODipine (NORVASC) 10 MG tablet Take 1 tablet by mouth daily.     APPLE CIDER VINEGAR PO Take by mouth daily. 2 tablespoons daily     aspirin 81 MG tablet 1 tablet daily.     B COMPLEX VITAMINS PO 1 tablet daily.     hydrochlorothiazide (HYDRODIURIL) 25 MG tablet Take 1 tablet by mouth daily.     losartan (COZAAR) 100 MG tablet Take 1 tablet (100 mg total) by mouth daily. 30 tablet 6   OMEGA-3 FATTY ACIDS PO Take 2 tablets by mouth daily.     psyllium (REGULOID) 0.52 G capsule Take 10 capsules by mouth daily.     No current facility-administered medications for this visit.    Patient confirms/reports the following allergies:  Allergies  Allergen Reactions   Lidocaine     Other reaction(s): Dizzyness, Unconsciousness    No orders of the defined types were placed in this encounter.    AUTHORIZATION INFORMATION Primary Insurance: 1D#: Group #:  Secondary Insurance: 1D#: Group #:  SCHEDULE INFORMATION: Date: 01-17-2015 Time: Location:[facility]

## 2015-02-27 NOTE — Discharge Instructions (Signed)

## 2015-02-28 ENCOUNTER — Encounter
Admission: RE | Disposition: A | Discharge status: Home / Self Care | Payer: BLUE CROSS/BLUE SHIELD | Source: Ambulatory Visit | Attending: Gastroenterology

## 2015-02-28 ENCOUNTER — Ambulatory Visit: Payer: BLUE CROSS/BLUE SHIELD | Admitting: Anesthesiology

## 2015-02-28 ENCOUNTER — Encounter: Payer: Self-pay | Admitting: *Deleted

## 2015-02-28 ENCOUNTER — Ambulatory Visit
Admission: RE | Admit: 2015-02-28 | Discharge: 2015-02-28 | Disposition: A | Discharge status: Home / Self Care | Payer: BLUE CROSS/BLUE SHIELD | Source: Ambulatory Visit | Attending: Gastroenterology | Admitting: Gastroenterology

## 2015-02-28 ENCOUNTER — Other Ambulatory Visit: Payer: Self-pay | Admitting: Gastroenterology

## 2015-02-28 DIAGNOSIS — D125 Benign neoplasm of sigmoid colon: Secondary | ICD-10-CM | POA: Diagnosis not present

## 2015-02-28 DIAGNOSIS — K635 Polyp of colon: Secondary | ICD-10-CM | POA: Insufficient documentation

## 2015-02-28 DIAGNOSIS — Z79899 Other long term (current) drug therapy: Secondary | ICD-10-CM | POA: Diagnosis not present

## 2015-02-28 DIAGNOSIS — K5730 Diverticulosis of large intestine without perforation or abscess without bleeding: Secondary | ICD-10-CM | POA: Diagnosis not present

## 2015-02-28 DIAGNOSIS — Z823 Family history of stroke: Secondary | ICD-10-CM | POA: Insufficient documentation

## 2015-02-28 DIAGNOSIS — I10 Essential (primary) hypertension: Secondary | ICD-10-CM | POA: Diagnosis not present

## 2015-02-28 DIAGNOSIS — Z7982 Long term (current) use of aspirin: Secondary | ICD-10-CM | POA: Insufficient documentation

## 2015-02-28 DIAGNOSIS — Z809 Family history of malignant neoplasm, unspecified: Secondary | ICD-10-CM | POA: Diagnosis not present

## 2015-02-28 DIAGNOSIS — K640 First degree hemorrhoids: Secondary | ICD-10-CM | POA: Insufficient documentation

## 2015-02-28 DIAGNOSIS — Z1211 Encounter for screening for malignant neoplasm of colon: Secondary | ICD-10-CM | POA: Insufficient documentation

## 2015-02-28 DIAGNOSIS — Z8249 Family history of ischemic heart disease and other diseases of the circulatory system: Secondary | ICD-10-CM | POA: Insufficient documentation

## 2015-02-28 HISTORY — PX: COLONOSCOPY WITH PROPOFOL: SHX5780

## 2015-02-28 SURGERY — COLONOSCOPY WITH PROPOFOL
Anesthesia: Monitor Anesthesia Care

## 2015-02-28 MED ORDER — LIDOCAINE HCL (CARDIAC) 20 MG/ML IV SOLN
INTRAVENOUS | Status: DC | PRN
  Administered 2015-02-28: 30 mg via INTRAVENOUS

## 2015-02-28 MED ORDER — LACTATED RINGERS IV SOLN
INTRAVENOUS | Status: DC
  Administered 2015-02-28 (×2): via INTRAVENOUS

## 2015-02-28 MED ORDER — PROPOFOL 10 MG/ML IV BOLUS
INTRAVENOUS | Status: DC | PRN
  Administered 2015-02-28: 100 mg via INTRAVENOUS

## 2015-02-28 MED ORDER — SODIUM CHLORIDE 0.9 % IV SOLN: INTRAVENOUS | Status: DC

## 2015-02-28 MED ORDER — STERILE WATER FOR IRRIGATION IR SOLN
Status: DC | PRN
  Administered 2015-02-28: 12:00:00

## 2015-02-28 SURGICAL SUPPLY — 29 items

## 2015-02-28 NOTE — Anesthesia Postprocedure Evaluation (Signed)
  Anesthesia Post-op Note  Patient: Randy Ingram  Procedure(s) Performed: Procedure(s): COLONOSCOPY WITH PROPOFOL (N/A)  Anesthesia type:MAC  Patient location: PACU  Post pain: Pain level controlled  Post assessment: Post-op Vital signs reviewed, Patient's Cardiovascular Status Stable, Respiratory Function Stable, Patent Airway and No signs of Nausea or vomiting  Post vital signs: Reviewed and stable  Last Vitals:  Filed Vitals:   02/28/15 1211  BP: 108/66  Pulse: 67  Temp:   Resp: 27    Level of consciousness: awake, alert  and patient cooperative  Complications: No apparent anesthesia complications

## 2015-02-28 NOTE — Transfer of Care (Signed)
Immediate Anesthesia Transfer of Care Note  Patient: Randy Ingram  Procedure(s) Performed: Procedure(s): COLONOSCOPY WITH PROPOFOL (N/A)  Patient Location: PACU  Anesthesia Type: MAC  Level of Consciousness: awake, alert  and patient cooperative  Airway and Oxygen Therapy: Patient Spontanous Breathing and Patient connected to supplemental oxygen  Post-op Assessment: Post-op Vital signs reviewed, Patient's Cardiovascular Status Stable, Respiratory Function Stable, Patent Airway and No signs of Nausea or vomiting  Post-op Vital Signs: Reviewed and stable  Complications: No apparent anesthesia complications

## 2015-02-28 NOTE — Op Note (Signed)
[facility] Gastroenterology Patient Name: Randy Ingram Procedure Date: 02/28/2015 11:47 AM MRN: [id number] Account #: [id number] Date of Birth: 02-Mar-1956 Admit Type: Outpatient Age: 59 Room: [facility] OR ROOM 01 Gender: Male Note Status: Finalized Procedure:         Colonoscopy Indications:       Screening for colorectal malignant neoplasm Providers:         Lucilla Lame, MD Referring MD:      Vickki Muff. Chrismon, MD (Referring MD) Medicines:         Propofol per Anesthesia Complications:     No immediate complications. Procedure:         Pre-Anesthesia Assessment:                    - Prior to the procedure, a History and Physical was                     performed, and patient medications and allergies were                     reviewed. The patient's tolerance of previous anesthesia                     was also reviewed. The risks and benefits of the procedure                     and the sedation options and risks were discussed with the                     patient. All questions were answered, and informed consent                     was obtained. Prior Anticoagulants: The patient has taken                     no previous anticoagulant or antiplatelet agents. ASA                     Grade Assessment: II - A patient with mild systemic                     disease. After reviewing the risks and benefits, the                     patient was deemed in satisfactory condition to undergo                     the procedure.                    After obtaining informed consent, the colonoscope was                     passed under direct vision. Throughout the procedure, the                     patient's blood pressure, pulse, and oxygen saturations                     were monitored continuously. The Olympus CF-HQ190L                     Colonoscope (S#. [id number]) was introduced through the anus  and advanced to the the cecum, identified by appendiceal                  orifice and ileocecal valve. The colonoscopy was performed                     without difficulty. The patient tolerated the procedure                     well. The quality of the bowel preparation was excellent. Findings:      The perianal and digital rectal examinations were normal.      Three sessile polyps were found in the sigmoid colon. The polyps were 3       to 4 mm in size. These polyps were removed with a cold biopsy forceps.       Resection and retrieval were complete.      A few small-mouthed diverticula were found in the sigmoid colon.      Non-bleeding internal hemorrhoids were found during retroflexion. The       hemorrhoids were Grade I (internal hemorrhoids that do not prolapse). Impression:        - Three 3 to 4 mm polyps in the sigmoid colon. Resected                     and retrieved.                    - Diverticulosis in the sigmoid colon.                    - Non-bleeding internal hemorrhoids. Recommendation:    - Await pathology results.                    - Repeat colonoscopy in 5 years if polyp adenoma and 10                     years if hyperplastic Procedure Code(s): --- Professional ---                    [phone], Colonoscopy, flexible; with biopsy, single or                     multiple Diagnosis Code(s): --- Professional ---                    Z12.11, Encounter for screening for malignant neoplasm of                     colon                    D12.5, Benign neoplasm of sigmoid colon CPT copyright 2014 American Medical Association. All rights reserved. The codes documented in this report are preliminary and upon coder review may  be revised to meet current compliance requirements. Lucilla Lame, MD 02/28/2015 12:03:33 PM This report has been signed electronically. Number of Addenda: 0 Note Initiated On: 02/28/2015 11:47 AM Scope Withdrawal Time: 0 hours 7 minutes 43 seconds  Total Procedure Duration: 0 hours 9 minutes 45 seconds        [facility]

## 2015-02-28 NOTE — H&P (Signed)
**Note Randy-Identified via Obfuscation**   [facility]  [address], [place],  [address] Phone: [phone] Fax : [phone]  Primary Care Physician:  Vernie Murders, [place] Primary Gastroenterologist:  Dr. Allen Norris  Pre-Procedure History & Physical: HPI:  Randy Ingram is a 59 y.o. male is here for a screening colonoscopy.   Past Medical History  Diagnosis Date  . Hypertension     controlled on meds    History reviewed. No pertinent past surgical history.  Prior to Admission medications   Medication Sig Start Date End Date Taking? Authorizing Provider  amLODipine (NORVASC) 10 MG tablet Take 1 tablet by mouth daily. am 01/28/14  Yes Historical Provider, MD  B COMPLEX VITAMINS PO 1 tablet daily. am 06/20/08  Yes Historical Provider, MD  hydrochlorothiazide (HYDRODIURIL) 25 MG tablet Take 1 tablet by mouth daily. am 08/05/14  Yes Historical Provider, MD  losartan (COZAAR) 100 MG tablet Take 1 tablet (100 mg total) by mouth daily. Patient taking differently: Take 100 mg by mouth daily. am 12/09/14  Yes Clearnce Sorrel Burnette, PA-C  OMEGA-3 FATTY ACIDS PO Take 2 tablets by mouth daily. am 06/20/08  Yes Historical Provider, MD  [place]    Yes Historical Provider, MD  psyllium (REGULOID) 0.52 G capsule Take 10 capsules by mouth daily.   Yes Historical Provider, MD  APPLE CIDER VINEGAR PO Take by mouth daily. 2 tablespoons daily/am    Historical Provider, MD  aspirin 81 MG tablet 1 tablet daily. 06/20/08   Historical Provider, MD    Allergies as of 12/13/2014 - Review Complete 09/26/2014  Allergen Reaction Noted  . Lidocaine  08/23/2014    Family History  Problem Relation Age of Onset  . Cancer Father   . Hypertension Sister   . Stroke Brother   . Heart disease Maternal Grandfather   . Parkinson's disease Brother     Social History   Social History  . Marital Status: Married    Spouse Name: N/A  . Number of Children: N/A  . Years of Education: N/A   Occupational History    . Not on file.   Social History Main Topics  . Smoking status: Never Smoker   . Smokeless tobacco: Not on file  . Alcohol Use: No  . Drug Use: No  . Sexual Activity: Not on file   Other Topics Concern  . Not on file   Social History Narrative    Review of Systems: See HPI, otherwise negative ROS  Physical Exam: There were no vitals taken for this visit. General:   Alert,  pleasant and cooperative in NAD Head:  Normocephalic and atraumatic. Neck:  Supple; no masses or thyromegaly. Lungs:  Clear throughout to auscultation.    Heart:  Regular rate and rhythm. Abdomen:  Soft, nontender and nondistended. Normal bowel sounds, without guarding, and without rebound.   Neurologic:  Alert and  oriented x4;  grossly normal neurologically.  Impression/Plan: Randy Ingram is now here to undergo a screening colonoscopy.  Risks, benefits, and alternatives regarding colonoscopy have been reviewed with the patient.  Questions have been answered.  All parties agreeable.

## 2015-02-28 NOTE — Anesthesia Preprocedure Evaluation (Signed)
Anesthesia Evaluation  Patient identified by MRN, date of birth, ID band Patient awake    Reviewed: Allergy & Precautions, H&P , NPO status , Patient's Chart, lab work & pertinent test results, reviewed documented beta blocker date and time   Airway Mallampati: II  TM Distance: >3 FB Neck ROM: full    Dental no notable dental hx.    Pulmonary neg pulmonary ROS,    Pulmonary exam normal breath sounds clear to auscultation       Cardiovascular Exercise Tolerance: Good hypertension,  Rhythm:regular Rate:Normal     Neuro/Psych negative neurological ROS  negative psych ROS   GI/Hepatic negative GI ROS, Neg liver ROS,   Endo/Other  negative endocrine ROS  Renal/GU negative Renal ROS  negative genitourinary   Musculoskeletal   Abdominal   Peds  Hematology negative hematology ROS (+)   Anesthesia Other Findings   Reproductive/Obstetrics negative OB ROS                             Anesthesia Physical Anesthesia Plan  ASA: II  Anesthesia Plan: MAC   Post-op Pain Management:    Induction:   Airway Management Planned:   Additional Equipment:   Intra-op Plan:   Post-operative Plan:   Informed Consent: I have reviewed the patients History and Physical, chart, labs and discussed the procedure including the risks, benefits and alternatives for the proposed anesthesia with the patient or authorized representative who has indicated his/her understanding and acceptance.   Dental Advisory Given  Plan Discussed with: CRNA  Anesthesia Plan Comments:         Anesthesia Quick Evaluation  

## 2015-02-28 NOTE — Anesthesia Procedure Notes (Signed)
Procedure Name: MAC Performed by: Leatha Rohner Pre-anesthesia Checklist: Patient identified, Emergency Drugs available, Suction available, Patient being monitored and Timeout performed Patient Re-evaluated:Patient Re-evaluated prior to inductionOxygen Delivery Method: Nasal cannula       

## 2015-03-03 ENCOUNTER — Encounter: Payer: Self-pay | Admitting: Gastroenterology

## 2015-04-23 ENCOUNTER — Other Ambulatory Visit: Payer: Self-pay | Admitting: Family Medicine

## 2015-06-24 ENCOUNTER — Other Ambulatory Visit: Payer: Self-pay | Admitting: Family Medicine

## 2015-06-24 DIAGNOSIS — I10 Essential (primary) hypertension: Secondary | ICD-10-CM

## 2015-06-24 MED ORDER — LOSARTAN POTASSIUM 100 MG PO TABS: 100.0000 mg | ORAL_TABLET | Freq: Every day | ORAL | Status: DC

## 2015-08-17 ENCOUNTER — Other Ambulatory Visit: Payer: Self-pay | Admitting: Family Medicine

## 2015-09-02 ENCOUNTER — Encounter: Payer: Self-pay | Admitting: Family Medicine

## 2015-09-02 ENCOUNTER — Ambulatory Visit (INDEPENDENT_AMBULATORY_CARE_PROVIDER_SITE_OTHER): Payer: Self-pay | Admitting: Unknown Physician Specialty

## 2015-09-02 ENCOUNTER — Ambulatory Visit (INDEPENDENT_AMBULATORY_CARE_PROVIDER_SITE_OTHER): Payer: BLUE CROSS/BLUE SHIELD | Admitting: Family Medicine

## 2015-09-02 ENCOUNTER — Encounter: Payer: Self-pay | Admitting: Unknown Physician Specialty

## 2015-09-02 VITALS — BP 154/84 | HR 80 | Temp 98.3°F | Ht 69.1 in | Wt 230.4 lb

## 2015-09-02 VITALS — BP 138/86 | HR 74 | Temp 98.0°F | Resp 16 | Wt 232.0 lb

## 2015-09-02 DIAGNOSIS — I10 Essential (primary) hypertension: Secondary | ICD-10-CM | POA: Diagnosis not present

## 2015-09-02 DIAGNOSIS — Z0000 Encounter for general adult medical examination without abnormal findings: Secondary | ICD-10-CM

## 2015-09-02 LAB — URINALYSIS, DIPSTICK ONLY
BILIRUBIN UA: NEGATIVE
Glucose, UA: NEGATIVE
KETONES UA: NEGATIVE
Leukocytes, UA: NEGATIVE
NITRITE UA: NEGATIVE
PH UA: 7 (ref 5.0–7.5)
PROTEIN UA: NEGATIVE
RBC UA: NEGATIVE
SPEC GRAV UA: 1.015 (ref 1.005–1.030)
Urobilinogen, Ur: 0.2 mg/dL (ref 0.2–1.0)

## 2015-09-02 NOTE — Progress Notes (Signed)
   BP 154/84 mmHg  Pulse 80  Temp(Src) 98.3 F (36.8 C)  Ht 5' 9.1" (1.755 m)  Wt 230 lb 6.4 oz (104.509 kg)  BMI 33.93 kg/m2  SpO2 80%   Subjective:    Patient ID: Randy Ingram, male    DOB: 12/16/1955, 60 y.o.   MRN: [id number]  HPI: Randy Ingram is a 60 y.o. male  Chief Complaint  Patient presents with  . Employment Physical    DOT physical    Relevant past medical, surgical, family and social history reviewed and updated as indicated. Interim medical history since our last visit reviewed. Allergies and medications reviewed and updated.  Review of Systems  Per HPI unless specifically indicated above     Objective:    BP 154/84 mmHg  Pulse 80  Temp(Src) 98.3 F (36.8 C)  Ht 5' 9.1" (1.755 m)  Wt 230 lb 6.4 oz (104.509 kg)  BMI 33.93 kg/m2  SpO2 80%  Wt Readings from Last 3 Encounters:  09/02/15 230 lb 6.4 oz (104.509 kg)  02/28/15 223 lb (101.152 kg)  09/26/14 230 lb (104.327 kg)    Physical Exam  Results for orders placed or performed during the hospital encounter of 09/26/14  Dept of Transp dipstick, urine  Result Value Ref Range   Protein, ur NEGATIVE NEGATIVE mg/dL   Glucose, UA NEGATIVE NEGATIVE mg/dL   Specific Gravity, Urine 1.020 1.005 - 1.030   Hgb urine dipstick NEGATIVE NEGATIVE      BP elevated today.  Elected to f/u with primary care provider and return in 2 weeks.    Assessment & Plan:   Problem List Items Addressed This Visit    None    Visit Diagnoses    Routine general medical examination at a health care facility    -  Primary    Relevant Orders    Urinalysis, dipstick only        Follow up plan: Return in about 2 weeks (around 09/16/2015) for DOT.

## 2015-09-02 NOTE — Progress Notes (Signed)
Patient ID: Randy Ingram, male   DOB: 12/16/1955, 60 y.o.   MRN: [id number]   Patient: Randy Ingram Male    DOB: 1956/02/13   60 y.o.   MRN: [id number] Visit Date: 09/02/2015  Today's Provider: Vernie Murders, PA   Chief Complaint  Patient presents with  . Hypertension  . Follow-up   Subjective:    HPI  Hypertension, follow-up:   BP Readings from Last 3 Encounters:  09/02/15 138/86  09/02/15 154/84  02/28/15 108/66    He was last seen for hypertension on 09/23/2014 BP at that visit was 148/84. Management changes since that visit include continue HCTZ, Losartan and Amlodipine. He reports good compliance with treatment. He is not having side effects.  He reports sometimes exercising. He reports sometimes being adherent to low salt diet.   Outside blood pressures are being checked at pharmacy sometimes. He is experiencing none.  Patient denies none.   Cardiovascular risk factors include dyslipidemia, hypertension, male gender and obesity (BMI >= 30 kg/m2).  Use of agents associated with hypertension: none.     Weight trend: stable Wt Readings from Last 3 Encounters:  09/02/15 232 lb (105.235 kg)  09/02/15 230 lb 6.4 oz (104.509 kg)  02/28/15 223 lb (101.152 kg)     ------------------------------------------------------------------------ Past Medical History  Diagnosis Date  . Hypertension     controlled on meds     Previous Medications   AMLODIPINE (NORVASC) 10 MG TABLET    take 1 tablet by mouth once daily   APPLE CIDER VINEGAR PO    Take by mouth daily. 2 tablespoons daily/am   ASPIRIN 81 MG TABLET    1 tablet daily.   B COMPLEX VITAMINS PO    1 tablet daily. am   HYDROCHLOROTHIAZIDE (HYDRODIURIL) 25 MG TABLET    take 1 tablet by mouth once daily   LOSARTAN (COZAAR) 100 MG TABLET    Take 1 tablet (100 mg total) by mouth daily.   OMEGA-3 FATTY ACIDS PO    Take 2 tablets by mouth daily. am   OVER THE COUNTER MEDICATION       PSYLLIUM (REGULOID) 0.52 G  CAPSULE    Take 10 capsules by mouth daily.   Allergies  Allergen Reactions  . Lidocaine     Other reaction(s): Dizzyness, Unconsciousness    Review of Systems  Constitutional: Negative.   HENT: Negative.   Eyes: Negative.   Respiratory: Negative.   Cardiovascular: Negative.   Gastrointestinal: Negative.   Endocrine: Negative.   Genitourinary: Negative.   Musculoskeletal: Negative.   Skin: Negative.   Allergic/Immunologic: Negative.   Neurological: Negative.   Hematological: Negative.   Psychiatric/Behavioral: Negative.     Social History  Substance Use Topics  . Smoking status: Never Smoker   . Smokeless tobacco: Never Used  . Alcohol Use: No   Objective:   BP 138/86 mmHg  Pulse 74  Temp(Src) 98 F (36.7 C) (Oral)  Resp 16  Wt 232 lb (105.235 kg)  Physical Exam  Constitutional: He appears well-developed and well-nourished.  HENT:  Head: Normocephalic.  Cardiovascular: Normal rate and regular rhythm.   Pulmonary/Chest: Effort normal and breath sounds normal.  Musculoskeletal: He exhibits no edema.  Psychiatric: He has a normal mood and affect. His behavior is normal.      Assessment & Plan:     Follow up: 1. 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 States he was a  little uncomfortable in the new environment and was sure he had another episode of "White Coat Syndrome". Feels more comfortable here this afternoon and BP better (138/86). States this is similar to reading he got at the pharmacy after leaving Dr. Rance Muir office. Sent note of vitals at this time and probably will be allowed to be certified for a year. Scheduling CPE early June 2017. Continue present medications and recheck prn.

## 2015-09-17 ENCOUNTER — Encounter: Payer: BLUE CROSS/BLUE SHIELD | Admitting: Unknown Physician Specialty

## 2015-09-23 ENCOUNTER — Encounter: Payer: Self-pay | Admitting: Unknown Physician Specialty

## 2015-09-23 ENCOUNTER — Ambulatory Visit (INDEPENDENT_AMBULATORY_CARE_PROVIDER_SITE_OTHER): Payer: BLUE CROSS/BLUE SHIELD | Admitting: Unknown Physician Specialty

## 2015-09-23 VITALS — BP 138/82 | HR 86 | Temp 98.5°F | Ht 68.3 in | Wt 229.2 lb

## 2015-09-23 DIAGNOSIS — Z0000 Encounter for general adult medical examination without abnormal findings: Secondary | ICD-10-CM

## 2015-09-23 NOTE — Progress Notes (Signed)
BP 138/82 mmHg  Pulse 86  Temp(Src) 98.5 F (36.9 C)  Ht 5' 8.3" (1.735 m)  Wt 229 lb 3.2 oz (103.964 kg)  BMI 34.54 kg/m2  SpO2 98%   Subjective:    Patient ID: Randy Ingram, male    DOB: August 16, 1955, 60 y.o.   MRN: [id number]  HPI: Randy Ingram is a 60 y.o. male  Chief Complaint  Patient presents with  . Employment Physical    DOT physical   DOT.  Pt with known white coat hypertension has a note from his primary stating BP is 138/82.  Elevated in this office as unfamiliar environment.    Relevant past medical, surgical, family and social history reviewed and updated as indicated. Interim medical history since our last visit reviewed. Allergies and medications reviewed and updated.  Review of Systems  Constitutional: Negative.   HENT: Negative.   Eyes: Negative.   Respiratory: Negative.   Cardiovascular: Negative.   Gastrointestinal: Negative.   Endocrine: Negative.   Genitourinary: Negative.   Skin: Negative.   Allergic/Immunologic: Negative.   Neurological: Negative.   Hematological: Negative.   Psychiatric/Behavioral: Negative.     Per HPI unless specifically indicated above     Objective:    BP 138/82 mmHg  Pulse 86  Temp(Src) 98.5 F (36.9 C)  Ht 5' 8.3" (1.735 m)  Wt 229 lb 3.2 oz (103.964 kg)  BMI 34.54 kg/m2  SpO2 98%  Wt Readings from Last 3 Encounters:  09/23/15 229 lb 3.2 oz (103.964 kg)  09/02/15 232 lb (105.235 kg)  09/02/15 230 lb 6.4 oz (104.509 kg)    Physical Exam  Constitutional: He is oriented to person, place, and time. He appears well-developed and well-nourished.  HENT:  Head: Normocephalic.  Right Ear: Tympanic membrane, external ear and ear canal normal.  Left Ear: Tympanic membrane, external ear and ear canal normal.  Mouth/Throat: Uvula is midline, oropharynx is clear and moist and mucous membranes are normal.  Eyes: Pupils are equal, round, and reactive to light.  Cardiovascular: Normal rate, regular rhythm and  normal heart sounds.  Exam reveals no gallop and no friction rub.   No murmur heard. Pulmonary/Chest: Effort normal and breath sounds normal. No respiratory distress.  Abdominal: Soft. Bowel sounds are normal. He exhibits no distension. There is no tenderness.  Musculoskeletal: Normal range of motion.  Neurological: He is alert and oriented to person, place, and time. He has normal reflexes.  Skin: Skin is warm and dry.  Psychiatric: He has a normal mood and affect. His behavior is normal. Judgment and thought content normal.    Results for orders placed or performed in visit on 09/02/15  Urinalysis, dipstick only  Result Value Ref Range   Specific Gravity, UA 1.015 1.005 - 1.030   pH, UA 7.0 5.0 - 7.5   Color, UA Yellow Yellow   Appearance Ur Clear Clear   Leukocytes, UA Negative Negative   Protein, UA Negative Negative/Trace   Glucose, UA Negative Negative   Ketones, UA Negative Negative   RBC, UA Negative Negative   Bilirubin, UA Negative Negative   Urobilinogen, Ur 0.2 0.2 - 1.0 mg/dL   Nitrite, UA Negative Negative      Assessment & Plan:   Problem List Items Addressed This Visit    None    Visit Diagnoses    Annual physical exam    -  Primary    DOT        Follow up plan: Return if  symptoms worsen or fail to improve.

## 2015-10-14 ENCOUNTER — Encounter: Payer: BLUE CROSS/BLUE SHIELD | Admitting: Family Medicine

## 2015-10-20 ENCOUNTER — Encounter: Payer: Self-pay | Admitting: Family Medicine

## 2015-10-20 ENCOUNTER — Ambulatory Visit (INDEPENDENT_AMBULATORY_CARE_PROVIDER_SITE_OTHER): Payer: BLUE CROSS/BLUE SHIELD | Admitting: Family Medicine

## 2015-10-20 VITALS — BP 138/84 | HR 80 | Temp 98.5°F | Resp 16 | Ht 69.0 in | Wt 235.0 lb

## 2015-10-20 DIAGNOSIS — E782 Mixed hyperlipidemia: Secondary | ICD-10-CM

## 2015-10-20 DIAGNOSIS — Z1159 Encounter for screening for other viral diseases: Secondary | ICD-10-CM | POA: Diagnosis not present

## 2015-10-20 DIAGNOSIS — Z0000 Encounter for general adult medical examination without abnormal findings: Secondary | ICD-10-CM

## 2015-10-20 DIAGNOSIS — Z114 Encounter for screening for human immunodeficiency virus [HIV]: Secondary | ICD-10-CM | POA: Diagnosis not present

## 2015-10-20 DIAGNOSIS — I10 Essential (primary) hypertension: Secondary | ICD-10-CM | POA: Diagnosis not present

## 2015-10-20 NOTE — Patient Instructions (Signed)

## 2015-10-20 NOTE — Progress Notes (Signed)
Patient: Randy Ingram, Male    DOB: 1955-04-28, 60 y.o.   MRN: [id number] Visit Date: 10/20/2015  Today's Provider: Vernie Murders, PA   Chief Complaint  Patient presents with  . Annual Exam   Subjective:    Annual physical exam Randy Ingram is a 60 y.o. male who presents today for health maintenance and complete physical. He feels well. He reports exercising occasionally. He reports he is sleeping well.  -----------------------------------------------------------------   Review of Systems  Constitutional: Negative.   HENT: Negative.   Eyes: Negative.   Respiratory: Negative.   Cardiovascular: Negative.   Gastrointestinal: Negative.   Endocrine: Negative.   Genitourinary: Negative.   Musculoskeletal: Negative.   Skin: Negative.   Allergic/Immunologic: Negative.   Neurological: Negative.   Hematological: Negative.   Psychiatric/Behavioral: Negative.      Social History      He  reports that he has never smoked. He has never used smokeless tobacco. He reports that he does not drink alcohol or use illicit drugs.       Social History   Social History  . Marital Status: Married    Spouse Name: N/A  . Number of Children: N/A  . Years of Education: N/A   Social History Main Topics  . Smoking status: Never Smoker   . Smokeless tobacco: Never Used  . Alcohol Use: No  . Drug Use: No  . Sexual Activity: Not Asked   Other Topics Concern  . None   Social History Narrative    Past Medical History  Diagnosis Date  . Hypertension     controlled on meds     Patient Active Problem List   Diagnosis Date Noted  . Special screening for malignant neoplasms, colon   . Benign neoplasm of sigmoid colon   . 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 BP (high blood pressure) 08/23/2014  . Essential (primary) hypertension 06/27/2007    Past Surgical History  Procedure Laterality Date  . Colonoscopy with propofol N/A 02/28/2015    Procedure: COLONOSCOPY WITH PROPOFOL;  Surgeon: Lucilla Lame, MD;  Location: [facility];  Service: Endoscopy;  Laterality: N/A;    Family History        Family Status  Relation Status Death Age  . Father Deceased   . Sister Alive   . Brother Alive   . Maternal Grandfather Deceased   . Brother Alive   . Mother Deceased   . Paternal Grandmother Deceased   . Sister Alive   . Sister Alive   . Sister Alive   . Sister Alive   . Brother Alive   . Brother Alive   . Maternal Grandmother Deceased   . Paternal Grandfather Deceased   . Sister Alive         His family history includes Cancer in his father; Heart disease in his maternal grandfather; Hypertension in his sister; Parkinson's disease in his brother; Stroke in his brother.    Allergies  Allergen Reactions  . Lidocaine     Other reaction(s): Dizzyness, Unconsciousness    Current Meds  Medication Sig  . amLODipine (NORVASC) 10 MG tablet take 1 tablet by mouth once daily  . APPLE CIDER VINEGAR PO Take by mouth daily. 2 tablespoons daily/am  . aspirin 81 MG tablet 1 tablet daily.  . B COMPLEX VITAMINS PO 1 tablet daily. am  . hydrochlorothiazide (HYDRODIURIL) 25 MG tablet take 1 tablet by mouth once daily  .  losartan (COZAAR) 100 MG tablet Take 1 tablet (100 mg total) by mouth daily.  . OMEGA-3 FATTY ACIDS PO Take 2 tablets by mouth daily. am  . Probiotic Product (PROBIOTIC PO) Take by mouth daily.  . psyllium (REGULOID) 0.52 G capsule Take 10 capsules by mouth daily.    Patient Care Team: Margo Common, PA as PCP - General (Physician Assistant)     Objective:   Vitals: BP 138/84 mmHg  Pulse 80  Temp(Src) 98.5 F (36.9 C)  Resp 16  Ht 5\' 9"  (1.753 m)  Wt 235 lb (106.595 kg)  BMI 34.69 kg/m2  Wt Readings from Last 3 Encounters:  10/20/15 235 lb (106.595 kg)  09/23/15 229 lb 3.2 oz (103.964 kg)  09/02/15 232 lb (105.235 kg)    Physical Exam  Constitutional: He is oriented to person, place, and  time. He appears well-developed and well-nourished.  HENT:  Head: Normocephalic and atraumatic.  Right Ear: External ear normal.  Left Ear: External ear normal.  Nose: Nose normal.  Mouth/Throat: Oropharynx is clear and moist.  Eyes: Conjunctivae and EOM are normal. Pupils are equal, round, and reactive to light. Right eye exhibits no discharge.  Neck: Normal range of motion. Neck supple. No tracheal deviation present. No thyromegaly present.  Cardiovascular: Normal rate, regular rhythm, normal heart sounds and intact distal pulses.   No murmur heard. Pulmonary/Chest: Effort normal and breath sounds normal. No respiratory distress. He has no wheezes. He has no rales. He exhibits no tenderness.  Abdominal: Soft. He exhibits no distension and no mass. There is no tenderness. There is no rebound and no guarding.  Genitourinary: Rectum normal, prostate normal and penis normal. Guaiac negative stool.  Musculoskeletal: Normal range of motion. He exhibits no edema or tenderness.  Lymphadenopathy:    He has no cervical adenopathy.  Neurological: He is alert and oriented to person, place, and time. He has normal reflexes. No cranial nerve deficit. He exhibits normal muscle tone. Coordination normal.  Skin: Skin is warm and dry. No rash noted. No erythema.  Psychiatric: He has a normal mood and affect. His behavior is normal. Judgment and thought content normal.     Depression Screen PHQ 2/9 Scores 09/23/2014  PHQ - 2 Score 0    Assessment & Plan:     Routine Health Maintenance and Physical Exam  Exercise Activities and Dietary recommendations Goals    Walk 1.5 miles twice a week. Recommend exercise 3-4 times a week and lowering caloric intake to 1600-1800 calories a day.      Immunization History  Administered Date(s) Administered  . Pneumococcal Polysaccharide-23 01/07/2012  . Tdap 01/18/2011, 01/07/2012    Health Maintenance  Topic Date Due  . Hepatitis C Screening  01-03-1956    . HIV Screening  07/27/1972  . INFLUENZA VACCINE  11/18/2015  . TETANUS/TDAP  01/06/2022  . COLONOSCOPY  02/27/2025      Discussed health benefits of physical activity, and encouraged him to engage in regular exercise appropriate for his age and condition.    -------------------------------------------------------------------- 1. Annual physical exam Good general health. Immunizations up to date. Dr. Allen Norris (gastroentrologist) plans next colonoscopy in 2026 (last exam Nov. 2016). Given anticipatory guidance.  2. Essential (primary) hypertension Good control on the Amlodipine, HCTZ and Losartan. Recheck labs and follow up pending reports. - CBC with Differential/Platelet - Comprehensive metabolic panel - TSH  3. Combined fat and carbohydrate induced hyperlipemia Trying to use OTC supplements to maintain control of lipids. Will recheck labs,  encourage to exercise more and lose weight. - Comprehensive metabolic panel - Lipid panel - TSH  4. Need for hepatitis C screening test - Hepatitis C Antibody  5. Screening for HIV without presence of risk factors - HIV antibody   Vernie Murders, PA  [facility]

## 2015-10-24 ENCOUNTER — Telehealth: Payer: Self-pay

## 2015-10-24 LAB — CBC WITH DIFFERENTIAL/PLATELET
BASOS ABS: 0 10*3/uL (ref 0.0–0.2)
Basos: 0 %
EOS (ABSOLUTE): 0.4 10*3/uL (ref 0.0–0.4)
EOS: 4 %
HEMATOCRIT: 44.9 % (ref 37.5–51.0)
HEMOGLOBIN: 15.2 g/dL (ref 12.6–17.7)
Immature Grans (Abs): 0 10*3/uL (ref 0.0–0.1)
Immature Granulocytes: 0 %
LYMPHS ABS: 2.2 10*3/uL (ref 0.7–3.1)
Lymphs: 27 %
MCH: 30.6 pg (ref 26.6–33.0)
MCHC: 33.9 g/dL (ref 31.5–35.7)
MCV: 91 fL (ref 79–97)
MONOS ABS: 0.6 10*3/uL (ref 0.1–0.9)
Monocytes: 7 %
Neutrophils Absolute: 4.9 10*3/uL (ref 1.4–7.0)
Neutrophils: 62 %
Platelets: 308 10*3/uL (ref 150–379)
RBC: 4.96 x10E6/uL (ref 4.14–5.80)
RDW: 14.3 % (ref 12.3–15.4)
WBC: 8 10*3/uL (ref 3.4–10.8)

## 2015-10-24 LAB — LIPID PANEL
CHOLESTEROL TOTAL: 164 mg/dL (ref 100–199)
Chol/HDL Ratio: 4.3 ratio units (ref 0.0–5.0)
HDL: 38 mg/dL — ABNORMAL LOW (ref >39)
LDL Calculated: 107 mg/dL — ABNORMAL HIGH (ref 0–99)
Triglycerides: 95 mg/dL (ref 0–149)
VLDL Cholesterol Cal: 19 mg/dL (ref 5–40)

## 2015-10-24 LAB — COMPREHENSIVE METABOLIC PANEL
ALBUMIN: 4.3 g/dL (ref 3.5–5.5)
ALK PHOS: 83 IU/L (ref 39–117)
ALT: 26 IU/L (ref 0–44)
AST: 20 IU/L (ref 0–40)
Albumin/Globulin Ratio: 1.8 (ref 1.2–2.2)
BILIRUBIN TOTAL: 0.3 mg/dL (ref 0.0–1.2)
BUN / CREAT RATIO: 16 (ref 9–20)
BUN: 14 mg/dL (ref 6–24)
CHLORIDE: 100 mmol/L (ref 96–106)
CO2: 26 mmol/L (ref 18–29)
Calcium: 8.8 mg/dL (ref 8.7–10.2)
Creatinine, Ser: 0.86 mg/dL (ref 0.76–1.27)
GFR calc Af Amer: 110 mL/min/{1.73_m2} (ref >59)
GFR calc non Af Amer: 96 mL/min/{1.73_m2} (ref >59)
GLOBULIN, TOTAL: 2.4 g/dL (ref 1.5–4.5)
GLUCOSE: 90 mg/dL (ref 65–99)
Potassium: 3.4 mmol/L — ABNORMAL LOW (ref 3.5–5.2)
SODIUM: 144 mmol/L (ref 134–144)
Total Protein: 6.7 g/dL (ref 6.0–8.5)

## 2015-10-24 LAB — HEPATITIS C ANTIBODY

## 2015-10-24 LAB — HIV ANTIBODY (ROUTINE TESTING W REFLEX): HIV SCREEN 4TH GENERATION: NONREACTIVE

## 2015-10-24 LAB — TSH: TSH: 2.06 u[IU]/mL (ref 0.450–4.500)

## 2015-10-24 NOTE — Telephone Encounter (Signed)
-----   Message from Margo Common, [place] sent at 10/24/2015  8:21 AM EDT ----- All blood tests normal except potassium and HDL a little low. Recommend extra fresh fruits and vegetables to help the potassium come up. Krill Oil [facility]) one daily should help HDL come back in line. Will recheck levels in 3 months. Don't forget to drink extra fluids, like Gatorade, in the hot weather.

## 2015-10-24 NOTE — Telephone Encounter (Signed)
Patient advised as below.  

## 2016-01-19 ENCOUNTER — Other Ambulatory Visit: Payer: Self-pay | Admitting: Family Medicine

## 2016-01-19 DIAGNOSIS — I10 Essential (primary) hypertension: Secondary | ICD-10-CM

## 2016-02-11 ENCOUNTER — Other Ambulatory Visit: Payer: Self-pay | Admitting: Family Medicine

## 2016-04-26 ENCOUNTER — Other Ambulatory Visit: Payer: Self-pay

## 2016-04-26 MED ORDER — AMLODIPINE BESYLATE 10 MG PO TABS: 10.0000 mg | ORAL_TABLET | Freq: Every day | ORAL | 3 refills | Status: DC

## 2016-04-26 NOTE — Telephone Encounter (Signed)
Refill request received from [facility]

## 2016-08-01 ENCOUNTER — Other Ambulatory Visit: Payer: Self-pay | Admitting: Family Medicine

## 2016-08-17 ENCOUNTER — Other Ambulatory Visit: Payer: Self-pay | Admitting: Family Medicine

## 2016-08-17 DIAGNOSIS — I10 Essential (primary) hypertension: Secondary | ICD-10-CM

## 2016-09-09 ENCOUNTER — Telehealth: Payer: Self-pay

## 2016-09-09 ENCOUNTER — Ambulatory Visit: Payer: Self-pay | Admitting: Family Medicine

## 2016-09-09 NOTE — Telephone Encounter (Signed)
Good to reschedule next week with full schedule today.

## 2016-09-09 NOTE — Telephone Encounter (Signed)
Patient reports that he can not make it 8:30 appointment due to family emergency. Patient reports that his wife had an accident and is being seen at this moment at another doctor's office. Patient would like to know if he can come in today for his BP check after his wife is done. CB# [phone]. sd

## 2016-09-09 NOTE — Progress Notes (Deleted)
       Patient: Randy Ingram Male    DOB: 09-17-1955   61 y.o.   MRN: [id number] Visit Date: 09/09/2016  Today's Provider: Vernie Murders, PA   No chief complaint on file.  Subjective:    HPI  Hypertension, follow-up:  BP Readings from Last 3 Encounters:  10/20/15 138/84  09/23/15 138/82  09/02/15 138/86    He was last seen for hypertension 12 months ago.  BP at that visit was 138/84. Management since that visit includes no changes continue meds. He reports {excellent/good/fair/poor:19665} compliance with treatment. He {ACTION; IS/IS [id number]} having side effects. *** He {is/is not:9024} exercising. He {is/is not:9024} adherent to low salt diet.   Outside blood pressures are ***. He is experiencing {Symptoms; cardiac:12860}.  Patient denies {Symptoms; cardiac:12860}.   Cardiovascular risk factors include advanced age (older than 27 for men, 15 for women), hypertension, male gender and obesity (BMI >= 30 kg/m2).     Weight trend: {trend:16658} Wt Readings from Last 3 Encounters:  10/20/15 235 lb (106.6 kg)  09/23/15 229 lb 3.2 oz (104 kg)  09/02/15 232 lb (105.2 kg)    Current diet: {diet habits:16563}  ------------------------------------------------------------------------  Lipid/Cholesterol, Follow-up:   Last seen for this12  Months ago.  Management changes since that visit include Krill oil. . Last Lipid Panel:    Component Value Date/Time   CHOL 164 10/23/2015 0852   TRIG 95 10/23/2015 0852   HDL 38 (L) 10/23/2015 0852   CHOLHDL 4.3 10/23/2015 0852   LDLCALC 107 (H) 10/23/2015 8185      He reports good compliance with treatment. He is not having side effects.  Current symptoms include {Symptoms; diabetes:14075} and have been {Desc; course:15616}.  -------------------------------------------------------------------     Allergies  Allergen Reactions  . Lidocaine     Other reaction(s): Dizzyness, Unconsciousness     Current  Outpatient Prescriptions:  .  amLODipine (NORVASC) 10 MG tablet, Take 1 tablet (10 mg total) by mouth daily., Disp: 90 tablet, Rfl: 3 .  APPLE CIDER VINEGAR PO, Take by mouth daily. 2 tablespoons daily/am, Disp: , Rfl:  .  aspirin 81 MG tablet, 1 tablet daily., Disp: , Rfl:  .  B COMPLEX VITAMINS PO, 1 tablet daily. am, Disp: , Rfl:  .  hydrochlorothiazide (HYDRODIURIL) 25 MG tablet, take 1 tablet by mouth once daily, Disp: 90 tablet, Rfl: 1 .  losartan (COZAAR) 100 MG tablet, take 1 tablet by mouth once daily, Disp: 90 tablet, Rfl: 0 .  OMEGA-3 FATTY ACIDS PO, Take 2 tablets by mouth daily. am, Disp: , Rfl:  .  OVER THE COUNTER MEDICATION, , Disp: , Rfl:  .  Probiotic Product (PROBIOTIC PO), Take by mouth daily., Disp: , Rfl:  .  psyllium (REGULOID) 0.52 G capsule, Take 10 capsules by mouth daily., Disp: , Rfl:   Review of Systems  Social History  Substance Use Topics  . Smoking status: Never Smoker  . Smokeless tobacco: Never Used  . Alcohol use No   Objective:   There were no vitals taken for this visit.   Physical Exam      Assessment & Plan:           Vernie Murders, PA  [facility]

## 2016-09-14 ENCOUNTER — Ambulatory Visit (INDEPENDENT_AMBULATORY_CARE_PROVIDER_SITE_OTHER): Payer: BLUE CROSS/BLUE SHIELD | Admitting: Family Medicine

## 2016-09-14 ENCOUNTER — Encounter: Payer: Self-pay | Admitting: *Deleted

## 2016-09-14 ENCOUNTER — Ambulatory Visit
Admission: EM | Admit: 2016-09-14 | Discharge: 2016-09-14 | Disposition: A | Discharge status: Home / Self Care | Payer: PRIVATE HEALTH INSURANCE | Attending: Family Medicine | Admitting: Family Medicine

## 2016-09-14 ENCOUNTER — Encounter: Payer: Self-pay | Admitting: Family Medicine

## 2016-09-14 VITALS — BP 150/90 | HR 67 | Temp 98.3°F | Wt 231.2 lb

## 2016-09-14 DIAGNOSIS — I10 Essential (primary) hypertension: Secondary | ICD-10-CM | POA: Diagnosis not present

## 2016-09-14 DIAGNOSIS — Z0289 Encounter for other administrative examinations: Secondary | ICD-10-CM

## 2016-09-14 DIAGNOSIS — Z024 Encounter for examination for driving license: Secondary | ICD-10-CM

## 2016-09-14 LAB — DEPT OF TRANSP DIPSTICK, URINE (ARMC ONLY)
Glucose, UA: NEGATIVE mg/dL
HGB URINE DIPSTICK: NEGATIVE
PROTEIN: NEGATIVE mg/dL
Specific Gravity, Urine: 1.025 (ref 1.005–1.030)

## 2016-09-14 NOTE — Progress Notes (Signed)
Patient: Randy Ingram Male    DOB: [birth year]   62 y.o.   MRN: [id number] Visit Date: 09/14/2016  Today's Provider: Vernie Murders, PA   Chief Complaint  Patient presents with  . Hypertension  . Hyperlipidemia  . Follow-up   Subjective:    HPI  Hypertension, follow-up:  BP Readings from Last 3 Encounters:  09/14/16 (!) 150/90  10/20/15 138/84  09/23/15 138/82    He was last seen for hypertension 10 months ago.  BP at that visit was 138/84. Management changes since that visit include continue medications. He reports good compliance with treatment. He is not having side effects.  He is exercising sometimes. He is adherent to low salt diet sometimes.   Outside blood pressures are being checked periodically. He is experiencing none.  Patient denies chest pain, chest pressure/discomfort, irregular heart beat and palpitations.   Cardiovascular risk factors include advanced age (older than 63 for men, 5 for women), dyslipidemia, hypertension, male gender and obesity (BMI >= 30 kg/m2).  Use of agents associated with hypertension: none.     Weight trend: stable Wt Readings from Last 3 Encounters:  09/14/16 231 lb 3.2 oz (104.9 kg)  10/20/15 235 lb (106.6 kg)  09/23/15 229 lb 3.2 oz (104 kg)    ------------------------------------------------------------------------  Lipid/Cholesterol, Follow-up:   Last seen for this10 months ago.  Management changes since that visit include Try Krill Oil to maintain lipids. . Last Lipid Panel:    Component Value Date/Time   CHOL 164 10/23/2015 0852   TRIG 95 10/23/2015 0852   HDL 38 (L) 10/23/2015 0852   CHOLHDL 4.3 10/23/2015 0852   LDLCALC 107 (H) 10/23/2015 4709    Risk factors for vascular disease include hypercholesterolemia and hypertension  He reports good compliance with treatment. He is not having side effects.  Current symptoms include none  Weight trend: stable Current diet: in general, a "healthy" diet     Current exercise: walking sometimes.  Wt Readings from Last 3 Encounters:  09/14/16 231 lb 3.2 oz (104.9 kg)  10/20/15 235 lb (106.6 kg)  09/23/15 229 lb 3.2 oz (104 kg)    ------------------------------------------------------------------- Past Medical History:  Diagnosis Date  . Hypertension    controlled on meds   Past Surgical History:  Procedure Laterality Date  . COLONOSCOPY WITH PROPOFOL N/A 02/28/2015   Procedure: COLONOSCOPY WITH PROPOFOL;  Surgeon: Lucilla Lame, MD;  Location: [facility];  Service: Endoscopy;  Laterality: N/A;   Family History  Problem Relation Age of Onset  . Cancer Father   . Hypertension Sister   . Stroke Brother   . Heart disease Maternal Grandfather   . Parkinson's disease Brother    Allergies  Allergen Reactions  . Lidocaine     Other reaction(s): Dizzyness, Unconsciousness     Previous Medications   AMLODIPINE (NORVASC) 10 MG TABLET    Take 1 tablet (10 mg total) by mouth daily.   APPLE CIDER VINEGAR PO    Take by mouth daily. 2 tablespoons daily/am   ASPIRIN 81 MG TABLET    1 tablet daily.   B COMPLEX VITAMINS PO    1 tablet daily. am   HYDROCHLOROTHIAZIDE (HYDRODIURIL) 25 MG TABLET    take 1 tablet by mouth once daily   KRILL OIL 1000 MG CAPS    Take by mouth.   LOSARTAN (COZAAR) 100 MG TABLET    take 1 tablet by mouth once daily   OMEGA-3 FATTY ACIDS PO  Take 1,000 mg by mouth daily. am   OVER THE COUNTER MEDICATION       PROBIOTIC PRODUCT (PROBIOTIC PO)    Take by mouth daily.   PSYLLIUM (REGULOID) 0.52 G CAPSULE    Take 10 capsules by mouth daily.    Review of Systems  Constitutional: Negative.   Respiratory: Negative.   Cardiovascular: Negative.   Musculoskeletal: Negative.     Social History  Substance Use Topics  . Smoking status: Never Smoker  . Smokeless tobacco: Never Used  . Alcohol use No   Objective:   BP (!) 150/90 (BP Location: Left Arm, Patient Position: Sitting, Cuff Size: Normal)   Pulse 67    Temp 98.3 F (36.8 C) (Oral)   Wt 231 lb 3.2 oz (104.9 kg)   SpO2 97%   BMI 34.14 kg/m  Recheck of BP after 20 minute rest was 140/82. Physical Exam  Constitutional: He is oriented to person, place, and time. He appears well-developed and well-nourished. No distress.  HENT:  Head: Normocephalic and atraumatic.  Right Ear: Hearing normal.  Left Ear: Hearing normal.  Nose: Nose normal.  Eyes: Conjunctivae and lids are normal. Right eye exhibits no discharge. Left eye exhibits no discharge. No scleral icterus.  Neck: Neck supple.  Cardiovascular: Normal rate and regular rhythm.   Pulmonary/Chest: Effort normal. No respiratory distress.  Abdominal: Soft.  Musculoskeletal: Normal range of motion.  Neurological: He is alert and oriented to person, place, and time.  Skin: Skin is intact. No lesion and no rash noted.  Psychiatric: He has a normal mood and affect. His speech is normal and behavior is normal. Thought content normal.      Assessment & Plan:     1. Essential (primary) hypertension Continues to tolerate Amlodipine 10 mg qd, Losartan 100 mg qd with HCTZ 25 mg qd. Recommend sodium restriction and follow up in a couple months for his annual physical. Given note with documentation of this regimen and BP readings for DOT physical.

## 2016-09-14 NOTE — ED Triage Notes (Signed)
Commercial drivers license physical exam 

## 2016-09-14 NOTE — ED Provider Notes (Signed)
CSN: [id number]     Arrival date & time 09/14/16  1324 History   First MD Initiated Contact with Patient 09/14/16 1609     Chief Complaint  Patient presents with  . 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 She has a history of hypertension is under the care of a provider that the provided him a note stating that he is currently on 3 different medications and his blood pressures have been running high at 140/90 in the office. He has a history of hypertension. Any additional hypertension he has hyper lipidemia.        Past Medical History:  Diagnosis Date  . Hypertension    controlled on meds   Past Surgical History:  Procedure Laterality Date  . COLONOSCOPY WITH PROPOFOL N/A 02/28/2015   Procedure: COLONOSCOPY WITH PROPOFOL;  Surgeon: Lucilla Lame, MD;  Location: [place];  Service: Endoscopy;  Laterality: N/A;   Family History  Problem Relation Age of Onset  . Cancer Father   . Hypertension Sister   . Stroke Brother   . Heart disease Maternal Grandfather   . Parkinson's disease Brother    Social History  Substance Use Topics  . Smoking status: Never Smoker  . Smokeless tobacco: Never Used  . Alcohol use No    Review of Systems  All other systems reviewed and are negative.   Allergies  Lidocaine  Home Medications   Prior to Admission medications   Medication Sig Start Date End Date Taking? Authorizing Provider  amLODipine (NORVASC) 10 MG tablet Take 1 tablet (10 mg total) by mouth daily. 04/26/16  Yes Chrismon, Vickki Muff, PA  APPLE CIDER VINEGAR PO Take by mouth daily. 2 tablespoons daily/am   Yes [provider]  aspirin 81 MG tablet 1 tablet daily. 06/20/08  Yes [provider]  B COMPLEX VITAMINS PO 1 tablet daily. am 06/20/08  Yes [provider]   hydrochlorothiazide (HYDRODIURIL) 25 MG tablet take 1 tablet by mouth once daily 08/02/16  Yes Chrismon, Vickki Muff, PA  Krill Oil 1000 MG CAPS Take by mouth.   Yes [provider]  losartan (COZAAR) 100 MG tablet take 1 tablet by mouth once daily 08/19/16  Yes Chrismon, Vickki Muff, PA  OMEGA-3 FATTY ACIDS PO Take 1,000 mg by mouth daily. am 06/20/08  Yes [provider]  [place]    Yes [provider]  Probiotic Product (PROBIOTIC PO) Take by mouth daily.    [provider]  psyllium (REGULOID) 0.52 G capsule Take 10 capsules by mouth daily.    [provider]   Meds Ordered and Administered this Visit  Medications - No data to display  BP 138/80 (BP Location: Left Arm)   Pulse 74   Temp 98 F (36.7 C) (Oral)   Resp 16   Ht 5\' 9"  (1.753 m)   Wt 230 lb (104.3 kg)   SpO2 98%   BMI 33.97 kg/m  No data found.   Physical Exam  Constitutional:  Deferred to DOT physical sheet  Nursing note and vitals reviewed.   Urgent Care Course     Procedures (including critical care time)  Labs Review Labs Reviewed  DEPT OF TRANSP DIPSTICK, URINE([facility] ONLY)    Imaging Review No results found.   Visual Acuity Review  Right Eye Distance:   Left Eye Distance:   Bilateral Distance:  Right Eye Near:   Left Eye Near:    Bilateral Near:         MDM   1. Encounter for examination required by Department of Transportation (DOT)    Patient's second blood pressure reading was acceptable. 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

## 2016-09-29 ENCOUNTER — Encounter: Payer: BLUE CROSS/BLUE SHIELD | Admitting: Unknown Physician Specialty

## 2016-10-28 ENCOUNTER — Encounter: Payer: Self-pay | Admitting: Family Medicine

## 2016-10-28 ENCOUNTER — Ambulatory Visit (INDEPENDENT_AMBULATORY_CARE_PROVIDER_SITE_OTHER): Payer: BLUE CROSS/BLUE SHIELD | Admitting: Family Medicine

## 2016-10-28 VITALS — BP 150/92 | HR 69 | Temp 98.5°F | Ht 69.0 in | Wt 234.2 lb

## 2016-10-28 DIAGNOSIS — E782 Mixed hyperlipidemia: Secondary | ICD-10-CM

## 2016-10-28 DIAGNOSIS — Z0000 Encounter for general adult medical examination without abnormal findings: Secondary | ICD-10-CM

## 2016-10-28 DIAGNOSIS — I10 Essential (primary) hypertension: Secondary | ICD-10-CM | POA: Diagnosis not present

## 2016-10-28 DIAGNOSIS — Z125 Encounter for screening for malignant neoplasm of prostate: Secondary | ICD-10-CM

## 2016-10-28 NOTE — Progress Notes (Signed)
Patient: Randy Ingram, Male    DOB: Feb 01, 1956, 61 y.o.   MRN: [id number] Visit Date: 10/28/2016  Today's Provider: Vernie Murders, PA   Chief Complaint  Patient presents with  . Annual Exam   Subjective:    Annual physical exam Randy Ingram is a 61 y.o. male who presents today for health maintenance and complete physical. He feels well. He reports exercising walking daily. He reports he is sleeping well.  -----------------------------------------------------------------   Review of Systems  Constitutional: Negative.   HENT: Negative.   Eyes: Negative.   Respiratory: Negative.   Cardiovascular: Negative.   Gastrointestinal: Negative.   Endocrine: Negative.   Genitourinary: Negative.   Musculoskeletal: Negative.   Skin: Negative.   Allergic/Immunologic: Negative.   Neurological: Negative.   Hematological: Negative.   Psychiatric/Behavioral: Negative.     Social History      He  reports that he has never smoked. He has never used smokeless tobacco. He reports that he does not drink alcohol or use drugs.       Social History   Social History  . Marital status: Married    Spouse name: N/A  . Number of children: N/A  . Years of education: N/A   Social History Main Topics  . Smoking status: Never Smoker  . Smokeless tobacco: Never Used  . Alcohol use No  . Drug use: No  . Sexual activity: Not Asked   Other Topics Concern  . None   Social History Narrative  . None   Past Medical History:  Diagnosis Date  . Hypertension    controlled on meds   Patient Active Problem List   Diagnosis Date Noted  . Special screening for malignant neoplasms, colon   . Benign neoplasm of sigmoid colon   . 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 BP (high blood pressure) 08/23/2014  . Essential (primary) hypertension 06/27/2007   Past Surgical History:  Procedure Laterality Date  . COLONOSCOPY WITH PROPOFOL N/A 02/28/2015   Procedure: COLONOSCOPY WITH PROPOFOL;  Surgeon: Lucilla Lame, MD;  Location: [place];  Service: Endoscopy;  Laterality: N/A;   Family History        Family Status  Relation Status  . Father Deceased  . Sister Alive  . Brother Alive  . MGF Deceased  . Brother Alive  . Mother Deceased  . PGM Deceased  . Sister Alive  . Sister Alive  . Sister Alive  . Sister Alive  . Brother Alive  . Brother Alive  . MGM Deceased  . PGF Deceased  . Sister Alive        His family history includes Cancer in his father; Heart disease in his maternal grandfather; Hypertension in his sister; Parkinson's disease in his brother; Stroke in his brother.     Allergies  Allergen Reactions  . Lidocaine     Other reaction(s): Dizzyness, Unconsciousness    Current Outpatient Prescriptions:  .  amLODipine (NORVASC) 10 MG tablet, Take 1 tablet (10 mg total) by mouth daily., Disp: 90 tablet, Rfl: 3 .  APPLE CIDER VINEGAR PO, Take by mouth daily. 2 tablespoons daily/am, Disp: , Rfl:  .  aspirin 81 MG tablet, 1 tablet daily., Disp: , Rfl:  .  B COMPLEX VITAMINS PO, 1 tablet daily. am, Disp: , Rfl:  .  hydrochlorothiazide (HYDRODIURIL) 25 MG tablet, take 1 tablet by mouth once daily, Disp: 90 tablet, Rfl: 1 .  Krill Oil 1000 MG  CAPS, Take by mouth., Disp: , Rfl:  .  losartan (COZAAR) 100 MG tablet, take 1 tablet by mouth once daily, Disp: 90 tablet, Rfl: 0 .  OMEGA-3 FATTY ACIDS PO, Take 1,000 mg by mouth daily. am, Disp: , Rfl:  .  OVER THE COUNTER MEDICATION, , Disp: , Rfl:  .  Probiotic Product (PROBIOTIC PO), Take by mouth daily., Disp: , Rfl:  .  psyllium (REGULOID) 0.52 G capsule, Take 10 capsules by mouth daily., Disp: , Rfl:    Patient Care Team: Chrismon, Vickki Muff, PA as PCP - General (Physician Assistant)      Objective:   Vitals: BP (!) 152/90 (BP Location: Right Arm, Patient Position: Sitting, Cuff Size: Normal)   Pulse 69   Temp 98.5 F (36.9 C) (Oral)   Ht 5\' 9"  (1.753 m)   Wt  234 lb 3.2 oz (106.2 kg)   SpO2 96%   BMI 34.59 kg/m    Wt Readings from Last 3 Encounters:  10/28/16 234 lb 3.2 oz (106.2 kg)  09/14/16 230 lb (104.3 kg)  09/14/16 231 lb 3.2 oz (104.9 kg)   Vitals:   10/28/16 0913  BP: (!) 152/90  Pulse: 69  Temp: 98.5 F (36.9 C)  TempSrc: Oral  SpO2: 96%  Weight: 234 lb 3.2 oz (106.2 kg)  Height: 5\' 9"  (1.753 m)    Physical Exam  Constitutional: He is oriented to person, place, and time. He appears well-developed and well-nourished.  HENT:  Head: Normocephalic and atraumatic.  Right Ear: External ear normal.  Left Ear: External ear normal.  Nose: Nose normal.  Mouth/Throat: Oropharynx is clear and moist.  Eyes: Pupils are equal, round, and reactive to light. Conjunctivae and EOM are normal. Right eye exhibits no discharge.  Neck: Normal range of motion. Neck supple. No tracheal deviation present. No thyromegaly present.  Cardiovascular: Normal rate, regular rhythm, normal heart sounds and intact distal pulses.   No murmur heard. Pulmonary/Chest: Effort normal and breath sounds normal. No respiratory distress. He has no wheezes. He has no rales. He exhibits no tenderness.  Abdominal: Soft. He exhibits no distension and no mass. There is no tenderness. There is no rebound and no guarding.  Genitourinary: Rectum normal, prostate normal and penis normal. Rectal exam shows guaiac negative stool.  Musculoskeletal: Normal range of motion. He exhibits no edema or tenderness.  Lymphadenopathy:    He has no cervical adenopathy.  Neurological: He is alert and oriented to person, place, and time. He has normal reflexes. No cranial nerve deficit. He exhibits normal muscle tone. Coordination normal.  Skin: Skin is warm and dry. No rash noted. No erythema.  Psychiatric: He has a normal mood and affect. His behavior is normal. Judgment and thought content normal.    Depression Screen PHQ 2/9 Scores 10/28/2016 09/23/2014  PHQ - 2 Score 0 0    Assessment & Plan:     Routine Health Maintenance and Physical Exam  Exercise Activities and Dietary recommendations Goals    Recommend 30-40 minutes of aerobic exercise 3-4 times a week and low fat diet with emphasis on some weight loss.      Immunization History  Administered Date(s) Administered  . Influenza Split 01/14/2014  . Pneumococcal Polysaccharide-23 01/07/2012  . Tdap 01/18/2011, 01/07/2012    Health Maintenance  Topic Date Due  . INFLUENZA VACCINE  11/17/2016  . TETANUS/TDAP  01/06/2022  . COLONOSCOPY  02/27/2025  . Hepatitis C Screening  Completed  . HIV Screening  Completed  Discussed health benefits of physical activity, and encouraged him to engage in regular exercise appropriate for his age and condition.    -------------------------------------------------------------------- 1. Annual physical exam Feels well in general. Given anticipatory guidance. Immunizations up to date and last colonoscopy was done in 2016. Check routine labs. - CBC with Differential/Platelet - Comprehensive metabolic panel - Lipid panel - TSH  2. Essential (primary) hypertension States he take Amlodipine, Losartan and HCTZ routinely. 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 Suspect some "White Coat Syndrome" effect. Recheck labs, lose a little weight and restrict sodium intake. - CBC with Differential/Platelet - Comprehensive metabolic panel - Lipid panel - TSH  3. Combined fat and carbohydrate induced hyperlipemia Trying to follow low fat diet. Recommend continuing Krill Oil and Psyllium supplement. Recheck CMP, Lipids and TSH. Should work on some weight loss. - Comprehensive metabolic panel - Lipid panel - TSH  4. Screening PSA (prostate specific antigen) Asymptomatic. - PSA    Vernie Murders, PA  [facility]

## 2016-10-28 NOTE — Patient Instructions (Signed)

## 2016-10-29 ENCOUNTER — Telehealth: Payer: Self-pay

## 2016-10-29 LAB — COMPREHENSIVE METABOLIC PANEL
ALT: 22 IU/L (ref 0–44)
AST: 21 IU/L (ref 0–40)
Albumin/Globulin Ratio: 1.7 (ref 1.2–2.2)
Albumin: 4.5 g/dL (ref 3.5–5.5)
Alkaline Phosphatase: 83 IU/L (ref 39–117)
BUN/Creatinine Ratio: 19 (ref 9–20)
BUN: 19 mg/dL (ref 6–24)
Bilirubin Total: 0.5 mg/dL (ref 0.0–1.2)
CO2: 28 mmol/L (ref 20–29)
Calcium: 9.2 mg/dL (ref 8.7–10.2)
Chloride: 102 mmol/L (ref 96–106)
Creatinine, Ser: 0.98 mg/dL (ref 0.76–1.27)
GFR calc non Af Amer: 84 mL/min/{1.73_m2} (ref >59)
GFR, EST AFRICAN AMERICAN: 97 mL/min/{1.73_m2} (ref >59)
GLUCOSE: 90 mg/dL (ref 65–99)
Globulin, Total: 2.6 g/dL (ref 1.5–4.5)
Potassium: 4.1 mmol/L (ref 3.5–5.2)
Sodium: 146 mmol/L — ABNORMAL HIGH (ref 134–144)
TOTAL PROTEIN: 7.1 g/dL (ref 6.0–8.5)

## 2016-10-29 LAB — CBC WITH DIFFERENTIAL/PLATELET
BASOS: 1 %
Basophils Absolute: 0.1 10*3/uL (ref 0.0–0.2)
EOS (ABSOLUTE): 0.2 10*3/uL (ref 0.0–0.4)
Eos: 3 %
Hematocrit: 45.4 % (ref 37.5–51.0)
Hemoglobin: 16 g/dL (ref 13.0–17.7)
IMMATURE GRANS (ABS): 0 10*3/uL (ref 0.0–0.1)
IMMATURE GRANULOCYTES: 0 %
LYMPHS: 29 %
Lymphocytes Absolute: 2.4 10*3/uL (ref 0.7–3.1)
MCH: 30.8 pg (ref 26.6–33.0)
MCHC: 35.2 g/dL (ref 31.5–35.7)
MCV: 88 fL (ref 79–97)
Monocytes Absolute: 0.6 10*3/uL (ref 0.1–0.9)
Monocytes: 7 %
NEUTROS PCT: 60 %
Neutrophils Absolute: 5 10*3/uL (ref 1.4–7.0)
PLATELETS: 285 10*3/uL (ref 150–379)
RBC: 5.19 x10E6/uL (ref 4.14–5.80)
RDW: 14.9 % (ref 12.3–15.4)
WBC: 8.3 10*3/uL (ref 3.4–10.8)

## 2016-10-29 LAB — LIPID PANEL
CHOL/HDL RATIO: 4.3 ratio (ref 0.0–5.0)
Cholesterol, Total: 196 mg/dL (ref 100–199)
HDL: 46 mg/dL (ref >39)
LDL Calculated: 121 mg/dL — ABNORMAL HIGH (ref 0–99)
Triglycerides: 143 mg/dL (ref 0–149)
VLDL CHOLESTEROL CAL: 29 mg/dL (ref 5–40)

## 2016-10-29 LAB — PSA: PROSTATE SPECIFIC AG, SERUM: 1.1 ng/mL (ref 0.0–4.0)

## 2016-10-29 LAB — TSH: TSH: 1.59 u[IU]/mL (ref 0.450–4.500)

## 2016-10-29 NOTE — Telephone Encounter (Signed)
LMTCB

## 2016-10-29 NOTE — Telephone Encounter (Signed)
-----   Message from Margo Common, [place] sent at 10/29/2016  8:19 AM EDT ----- All blood tests normal except LDL cholesterol elevated and sodium slightly elevated. May add Red Yeast Rice one daily to help cholesterol and drink extra water to get sodium back to normal. Recheck levels in 6 months.

## 2016-11-02 NOTE — Telephone Encounter (Signed)
Patient advised. He states the [employer] makes him feel bad so he is not willing to take the medication.

## 2016-11-25 ENCOUNTER — Other Ambulatory Visit: Payer: Self-pay | Admitting: Family Medicine

## 2016-11-25 DIAGNOSIS — I10 Essential (primary) hypertension: Secondary | ICD-10-CM

## 2017-02-18 ENCOUNTER — Other Ambulatory Visit: Payer: Self-pay | Admitting: Family Medicine

## 2017-02-18 MED ORDER — HYDROCHLOROTHIAZIDE 25 MG PO TABS: 25.0000 mg | ORAL_TABLET | Freq: Every day | ORAL | 1 refills | Status: DC

## 2017-02-18 NOTE — Telephone Encounter (Signed)
Pt contacted office for refill request on the following medications:  hydrochlorothiazide (HYDRODIURIL) 25 MG tablet   [facility].   Please advise. Thanks TNP

## 2017-05-17 ENCOUNTER — Other Ambulatory Visit: Payer: Self-pay | Admitting: Family Medicine

## 2017-07-04 ENCOUNTER — Ambulatory Visit (INDEPENDENT_AMBULATORY_CARE_PROVIDER_SITE_OTHER): Payer: BLUE CROSS/BLUE SHIELD | Admitting: Family Medicine

## 2017-07-04 ENCOUNTER — Encounter: Payer: Self-pay | Admitting: Family Medicine

## 2017-07-04 VITALS — BP 154/82 | HR 59 | Temp 98.5°F

## 2017-07-04 DIAGNOSIS — I10 Essential (primary) hypertension: Secondary | ICD-10-CM | POA: Diagnosis not present

## 2017-07-04 NOTE — Progress Notes (Signed)
Patient: Randy Ingram Male    DOB: July 07, 1955   62 y.o.   MRN: [id number] Visit Date: 07/04/2017  Today's Provider: Vernie Murders, PA   Chief Complaint  Patient presents with  . Hypertension  . Follow-up   Subjective:    HPI  Hypertension, follow-up:  BP Readings from Last 3 Encounters:  07/04/17 (!) 154/82  10/28/16 (!) 150/92  09/14/16 138/80    He was last seen for hypertension 8 months ago.  BP at that visit was 150/92. Management changes since that visit include continue medications, restrict sodium, work on weight loss and follow up in 3-4 months. He reports fair compliance with treatment. He is not having side effects.  He is not exercising. He is adherent to low salt diet.   Outside blood pressures are being checked occasionally. He is experiencing none.  Patient denies chest pain, chest pressure/discomfort, claudication, dyspnea, exertional chest pressure/discomfort, fatigue, irregular heart beat, lower extremity edema, near-syncope, orthopnea, palpitations, paroxysmal nocturnal dyspnea, syncope and tachypnea.   Cardiovascular risk factors include advanced age (older than 66 for men, 42 for women), dyslipidemia, hypertension, male gender and obesity (BMI >= 30 kg/m2).  Use of agents associated with hypertension: none.    Weight trend: stable  Wt Readings from Last 3 Encounters:  10/28/16 234 lb 3.2 oz (106.2 kg)  09/14/16 230 lb (104.3 kg)  09/14/16 231 lb 3.2 oz (104.9 kg)   ------------------------------------------------------------------------  Past Medical History:  Diagnosis Date  . Hypertension    controlled on meds   Patient Active Problem List   Diagnosis Date Noted  . Special screening for malignant neoplasms, colon   . Benign neoplasm of sigmoid colon   . 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 BP (high blood pressure) 08/23/2014  . Essential (primary) hypertension 06/27/2007   Past Surgical History:    Procedure Laterality Date  . COLONOSCOPY WITH PROPOFOL N/A 02/28/2015   Procedure: COLONOSCOPY WITH PROPOFOL;  Surgeon: Lucilla Lame, MD;  Location: [place];  Service: Endoscopy;  Laterality: N/A;   Family History  Problem Relation Age of Onset  . Cancer Father   . Hypertension Sister   . Stroke Brother   . Heart disease Maternal Grandfather   . Parkinson's disease Brother    Allergies  Allergen Reactions  . Lidocaine     Other reaction(s): Dizzyness, Unconsciousness    Current Outpatient Medications:  .  amLODipine (NORVASC) 10 MG tablet, TAKE 1 TABLET BY MOUTH ONCE DAILY, Disp: 90 tablet, Rfl: 3 .  aspirin 81 MG tablet, 1 tablet daily., Disp: , Rfl:  .  B COMPLEX VITAMINS PO, 1 tablet daily. am, Disp: , Rfl:  .  hydrochlorothiazide (HYDRODIURIL) 25 MG tablet, Take 1 tablet (25 mg total) by mouth daily., Disp: 90 tablet, Rfl: 1 .  Krill Oil 1000 MG CAPS, Take by mouth., Disp: , Rfl:  .  losartan (COZAAR) 100 MG tablet, TAKE 1 TABLET BY MOUTH ONCE DAILY, Disp: 90 tablet, Rfl: 3 .  OMEGA-3 FATTY ACIDS PO, Take 1,000 mg by mouth daily. am, Disp: , Rfl:  .  OVER THE COUNTER MEDICATION, , Disp: , Rfl:  .  psyllium (REGULOID) 0.52 G capsule, Take 10 capsules by mouth daily., Disp: , Rfl:   Review of Systems  Constitutional: Negative.   Respiratory: Negative.   Cardiovascular: Negative.    Social History   Tobacco Use  . Smoking status: Never Smoker  . Smokeless tobacco: Never Used  Substance Use Topics  . Alcohol use: No    Alcohol/week: 0.0 oz   Objective:   BP (!) 154/82 (BP Location: Right Arm, Patient Position: Sitting, Cuff Size: Normal)   Pulse (!) 59   Temp 98.5 F (36.9 C) (Oral)   SpO2 96%   Physical Exam  Constitutional: He is oriented to person, place, and time. He appears well-developed and well-nourished. No distress.  HENT:  Head: Normocephalic and atraumatic.  Right Ear: Hearing and external ear normal.  Left Ear: Hearing and external ear  normal.  Nose: Nose normal.  Mouth/Throat: Oropharynx is clear and moist.  Eyes: Conjunctivae and lids are normal. Right eye exhibits no discharge. Left eye exhibits no discharge. No scleral icterus.  Neck: Neck supple.  Cardiovascular: Normal rate and regular rhythm.  Pulmonary/Chest: Effort normal and breath sounds normal. No respiratory distress.  Abdominal: Soft. Bowel sounds are normal.  Musculoskeletal: Normal range of motion.  Neurological: He is alert and oriented to person, place, and time.  Skin: Skin is intact. No lesion and no rash noted.  Psychiatric: He has a normal mood and affect. His speech is normal and behavior is normal. Thought content normal.      Assessment & Plan:     1. Essential (primary) hypertension Tolerating the HCTZ 25 mg qd, Amlodipine 10 mg qd and Losartan 100 mg qd. Stopped caffeine, limits salt and ETOH intake. Has lost "13 lbs" on the new "Whole-30" diet the past couple weeks. Denies chest pains, DOE, diaphoresis, palpitations or edema. 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 Encouraged to continue present regimen and lose a little more weight. May have some White Coat Syndrome adding to difficulty controlling BP. Schedule for CPE in 3-4 months. Wants to postpone lab tests until that time.       Vernie Murders, PA  [facility]

## 2017-08-22 ENCOUNTER — Other Ambulatory Visit: Payer: Self-pay | Admitting: Family Medicine

## 2017-08-22 DIAGNOSIS — I10 Essential (primary) hypertension: Secondary | ICD-10-CM

## 2017-11-08 ENCOUNTER — Encounter: Payer: Self-pay | Admitting: Family Medicine

## 2017-11-08 ENCOUNTER — Ambulatory Visit (INDEPENDENT_AMBULATORY_CARE_PROVIDER_SITE_OTHER): Payer: 59 | Admitting: Family Medicine

## 2017-11-08 VITALS — BP 142/82 | HR 60 | Temp 98.5°F | Resp 16 | Ht 68.5 in | Wt 218.4 lb

## 2017-11-08 DIAGNOSIS — Z125 Encounter for screening for malignant neoplasm of prostate: Secondary | ICD-10-CM | POA: Diagnosis not present

## 2017-11-08 DIAGNOSIS — I10 Essential (primary) hypertension: Secondary | ICD-10-CM | POA: Diagnosis not present

## 2017-11-08 DIAGNOSIS — Z0000 Encounter for general adult medical examination without abnormal findings: Secondary | ICD-10-CM | POA: Diagnosis not present

## 2017-11-08 DIAGNOSIS — D225 Melanocytic nevi of trunk: Secondary | ICD-10-CM

## 2017-11-08 NOTE — Progress Notes (Signed)
  Subjective:     Patient ID: Randy Ingram, male   DOB: 09-29-1955, 62 y.o.   MRN: [id number] Chief Complaint  Patient presents with  . Annual Exam    Patient comes in office today for his annual physial, he states that he is feeling well today and has no qconcerns to address. Patient reports following a awell balanced diet, sleeping on average of 5-6hrs a night. Patient states that he is staying active daily with work duties but not actively exercising. Patients last reported Tdap 01/07/12, Colonoscopy 02/28/15   HPI States he is currently taking Ce[place] cinammon to help lower his blood pressure. Continues to work for a [employer]. 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  Review of Systems General: Feeling well HEENT: regular dental visits and eye exams (glasse). Cardiovascular: no chest pain, shortness of breath, or palpitations GI: no heartburn, no change in bowel habits or blood in the stool GU: nocturia x 0, no change in bladder habits  Psychiatric: not depressed Musculoskeletal: no joint pain Skin: lower back with small cyst which drains periodically.    Objective:   Physical Exam  Constitutional: He appears well-developed and well-nourished. No distress.  Eyes: PERRLA, EOMI Neck: no thyromegaly, tenderness or nodules, no cervical adenopathy, no carotid bruits ENT: TM's intact without inflammation; No tonsillar enlargement or exudate, Lungs: Clear Heart : RRR without murmur or gallop Abd: bowel sounds present, soft, non-tender, no organomegaly Rectal: Prostate exam attempted but not well felt.  Extremities: no edema Skin: Isolated atypical ,one cm. nevus with notched feature and variegation of color in lower back. Small s.c cyst also on lower back c/w epidermoid cyst.     Assessment:    1. Annual physical exam - Lipid panel  2. Essential (primary) hypertension - Comprehensive metabolic panel  3. Screening for prostate cancer - PSA  4. Atypical  nevus of back - Ambulatory referral to Dermatology    Plan:    Further f/u pending lab results.

## 2017-11-08 NOTE — Patient Instructions (Addendum)
We will call you with the lab results and about the dermatology referral.

## 2017-11-09 DIAGNOSIS — I10 Essential (primary) hypertension: Secondary | ICD-10-CM | POA: Diagnosis not present

## 2017-11-09 DIAGNOSIS — Z125 Encounter for screening for malignant neoplasm of prostate: Secondary | ICD-10-CM | POA: Diagnosis not present

## 2017-11-09 DIAGNOSIS — Z0000 Encounter for general adult medical examination without abnormal findings: Secondary | ICD-10-CM | POA: Diagnosis not present

## 2017-11-10 ENCOUNTER — Telehealth: Payer: Self-pay

## 2017-11-10 LAB — COMPREHENSIVE METABOLIC PANEL
ALT: 18 IU/L (ref 0–44)
AST: 18 IU/L (ref 0–40)
Albumin/Globulin Ratio: 1.8 (ref 1.2–2.2)
Albumin: 4.6 g/dL (ref 3.6–4.8)
Alkaline Phosphatase: 85 IU/L (ref 39–117)
BUN/Creatinine Ratio: 18 (ref 10–24)
BUN: 18 mg/dL (ref 8–27)
Bilirubin Total: 0.5 mg/dL (ref 0.0–1.2)
CALCIUM: 9.1 mg/dL (ref 8.6–10.2)
CO2: 28 mmol/L (ref 20–29)
CREATININE: 1 mg/dL (ref 0.76–1.27)
Chloride: 99 mmol/L (ref 96–106)
GFR calc Af Amer: 94 mL/min/{1.73_m2} (ref >59)
GFR, EST NON AFRICAN AMERICAN: 81 mL/min/{1.73_m2} (ref >59)
GLOBULIN, TOTAL: 2.5 g/dL (ref 1.5–4.5)
Glucose: 81 mg/dL (ref 65–99)
Potassium: 4.1 mmol/L (ref 3.5–5.2)
SODIUM: 144 mmol/L (ref 134–144)
Total Protein: 7.1 g/dL (ref 6.0–8.5)

## 2017-11-10 LAB — LIPID PANEL
CHOLESTEROL TOTAL: 200 mg/dL — AB (ref 100–199)
Chol/HDL Ratio: 4.3 ratio (ref 0.0–5.0)
HDL: 47 mg/dL (ref >39)
LDL CALC: 130 mg/dL — AB (ref 0–99)
Triglycerides: 116 mg/dL (ref 0–149)
VLDL Cholesterol Cal: 23 mg/dL (ref 5–40)

## 2017-11-10 LAB — PSA: Prostate Specific Ag, Serum: 1.2 ng/mL (ref 0.0–4.0)

## 2017-11-10 NOTE — Telephone Encounter (Signed)
Patient advised he states that he will work on [employer]. KW

## 2017-11-10 NOTE — Telephone Encounter (Signed)
[id number] 

## 2017-11-10 NOTE — Telephone Encounter (Signed)
-----   Message from [place], [place] sent at 11/10/2017  9:57 AM EDT ----- Mild cholesterol elevation with a calculated 10 year risk for developing cardiovascular disease of 12.5%. We usually start a medication at 7.5% risk. Do you wish to start a medication or try a Mediterranean diet for 3-6 months? The remainder of your labs looked good.

## 2017-11-21 ENCOUNTER — Ambulatory Visit (INDEPENDENT_AMBULATORY_CARE_PROVIDER_SITE_OTHER): Payer: 59 | Admitting: Family Medicine

## 2017-11-21 VITALS — BP 140/82 | HR 60 | Temp 98.1°F | Resp 16 | Wt 219.0 lb

## 2017-11-21 DIAGNOSIS — I10 Essential (primary) hypertension: Secondary | ICD-10-CM | POA: Diagnosis not present

## 2017-11-21 MED ORDER — CHLORTHALIDONE 25 MG PO TABS: 25.0000 mg | ORAL_TABLET | Freq: Every day | ORAL | 0 refills | Status: DC

## 2017-11-21 NOTE — Progress Notes (Signed)
  Subjective:     Patient ID: Randy Ingram, male   DOB: July 19, 1955, 62 y.o.   MRN: [id number] Chief Complaint  Patient presents with  . Hypertension   HPI Has had bp elevation outside of the office by other providers with elevation to 160/90's. 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 Reports compliance with medication and denies symptoms associated with the elevations.  Review of Systems     Objective:   Physical Exam  Constitutional: He appears well-developed. No distress.       Assessment:    1. Essential (primary) hypertension: will switch to chlorthalidone    Plan:    F/u next week for bp check.

## 2017-11-21 NOTE — Patient Instructions (Signed)
Return for bp check next week.

## 2017-11-29 ENCOUNTER — Other Ambulatory Visit: Payer: Self-pay | Admitting: Family Medicine

## 2017-11-29 MED ORDER — CHLORTHALIDONE 25 MG PO TABS: 25.0000 mg | ORAL_TABLET | Freq: Every day | ORAL | 3 refills | Status: DC

## 2017-11-29 NOTE — Progress Notes (Unsigned)
Patient is here today for nurse blood pressure check and the results are as followed: Left arm 131/ 71  right arm 132/70

## 2017-12-22 DIAGNOSIS — D225 Melanocytic nevi of trunk: Secondary | ICD-10-CM | POA: Diagnosis not present

## 2018-04-21 ENCOUNTER — Other Ambulatory Visit: Payer: Self-pay | Admitting: Family Medicine

## 2018-09-17 ENCOUNTER — Other Ambulatory Visit: Payer: Self-pay | Admitting: Family Medicine

## 2018-09-17 DIAGNOSIS — I10 Essential (primary) hypertension: Secondary | ICD-10-CM

## 2018-10-17 ENCOUNTER — Other Ambulatory Visit: Payer: Self-pay

## 2018-10-17 ENCOUNTER — Encounter: Payer: Self-pay | Admitting: Family Medicine

## 2018-10-17 ENCOUNTER — Ambulatory Visit (INDEPENDENT_AMBULATORY_CARE_PROVIDER_SITE_OTHER): Payer: 59 | Admitting: Family Medicine

## 2018-10-17 VITALS — BP 138/70 | HR 74 | Temp 98.7°F | Wt 222.0 lb

## 2018-10-17 DIAGNOSIS — I10 Essential (primary) hypertension: Secondary | ICD-10-CM

## 2018-10-17 DIAGNOSIS — E782 Mixed hyperlipidemia: Secondary | ICD-10-CM

## 2018-10-17 MED ORDER — LOSARTAN POTASSIUM 100 MG PO TABS: 100.0000 mg | ORAL_TABLET | Freq: Every day | ORAL | 3 refills | Status: DC

## 2018-10-17 MED ORDER — CHLORTHALIDONE 25 MG PO TABS: 25.0000 mg | ORAL_TABLET | Freq: Every day | ORAL | 3 refills | Status: DC

## 2018-10-17 MED ORDER — AMLODIPINE BESYLATE 10 MG PO TABS: ORAL_TABLET | ORAL | 3 refills | Status: DC

## 2018-10-17 NOTE — Progress Notes (Signed)
Randy Ingram  MRN: [id number] DOB: Jun 30, 1955  Subjective:  HPI  Patient Active Problem List   Diagnosis Date Noted  . Special screening for malignant neoplasms, colon   . Benign neoplasm of sigmoid colon   . 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 BP (high blood pressure) 08/23/2014  . Essential (primary) hypertension 06/27/2007   Past Medical History:  Diagnosis Date  . Hypertension    controlled on meds   Past Surgical History:  Procedure Laterality Date  . COLONOSCOPY WITH PROPOFOL N/A 02/28/2015   Procedure: COLONOSCOPY WITH PROPOFOL;  Surgeon: Lucilla Lame, MD;  Location: [facility];  Service: Endoscopy;  Laterality: N/A;   Family History  Problem Relation Age of Onset  . Cancer Father   . Hypertension Sister   . Stroke Brother   . Heart disease Maternal Grandfather   . Parkinson's disease Brother    Social History   Socioeconomic History  . Marital status: Married    Spouse name: Not on file  . Number of children: Not on file  . Years of education: Not on file  . Highest education level: Not on file  Occupational History  . Not on file  Social Needs  . Financial resource strain: Not on file  . Food insecurity    Worry: Not on file    Inability: Not on file  . Transportation needs    Medical: Not on file    Non-medical: Not on file  Tobacco Use  . Smoking status: Never Smoker  . Smokeless tobacco: Never Used  Substance and Sexual Activity  . Alcohol use: No    Alcohol/week: 0.0 standard drinks  . Drug use: No  . Sexual activity: Not on file  Lifestyle  . Physical activity    Days per week: Not on file    Minutes per session: Not on file  . Stress: Not on file  Relationships  . Social Herbalist on phone: Not on file    Gets together: Not on file    Attends religious service: Not on file    Active member of club or organization: Not on file    Attends meetings of clubs or organizations: Not on  file    Relationship status: Not on file  . Intimate partner violence    Fear of current or ex partner: Not on file    Emotionally abused: Not on file    Physically abused: Not on file    Forced sexual activity: Not on file  Other Topics Concern  . Not on file  Social History Narrative  . Not on file   Outpatient Encounter Medications as of 10/17/2018  Medication Sig  . amLODipine (NORVASC) 10 MG tablet 1 TABLET BY MOUTH ONCE DAILY  . B COMPLEX VITAMINS PO 1 tablet daily. am  . chlorthalidone (HYGROTON) 25 MG tablet Take 1 tablet (25 mg total) by mouth daily.  . Cinnamon 500 MG capsule Take 500 mg by mouth daily.  Javier Docker Oil 1000 MG CAPS Take by mouth.  . losartan (COZAAR) 100 MG tablet TAKE 1 TABLET BY MOUTH ONCE DAILY  . OMEGA-3 FATTY ACIDS PO Take 1,000 mg by mouth daily. am  . OVER THE COUNTER MEDICATION   . psyllium (REGULOID) 0.52 G capsule Take 10 capsules by mouth daily.   No facility-administered encounter medications on file as of 10/17/2018.    Allergies  Allergen Reactions  . Lidocaine     Other reaction(s):  Dizzyness, Unconsciousness   Review of Systems  Constitutional: Negative for fever.  HENT: Negative for congestion, ear pain and sore throat.   Respiratory: Negative for cough and shortness of breath.   Cardiovascular: Positive for leg swelling (minimal at the end of the day). Negative for chest pain, palpitations and claudication.  Neurological: Negative for dizziness and headaches.    Objective:  BP 138/70 (BP Location: Right Arm, Patient Position: Sitting, Cuff Size: Normal)   Pulse 74   Temp 98.7 F (37.1 C) (Oral)   Wt 222 lb (100.7 kg)   SpO2 98%   BMI 33.26 kg/m    Wt Readings from Last 3 Encounters:  10/17/18 222 lb (100.7 kg)  11/21/17 219 lb (99.3 kg)  11/08/17 218 lb 6.4 oz (99.1 kg)   BP Readings from Last 3 Encounters:  10/17/18 138/70  11/29/17 132/70  11/21/17 140/82   Physical Exam  Constitutional: He is oriented to person,  place, and time and well-developed, well-nourished, and in no distress.  HENT:  Head: Normocephalic.  Eyes: Conjunctivae are normal.  Neck: Neck supple.  Cardiovascular: Normal rate and regular rhythm.  Pulmonary/Chest: Effort normal.  Abdominal: Soft. Bowel sounds are normal.  Musculoskeletal: Normal range of motion.  Neurological: He is alert and oriented to person, place, and time.  Skin: No rash noted.  Psychiatric: Mood, affect and judgment normal.    Assessment and Plan :   1. Essential (primary) hypertension BP was 162/80 immediately arriving in the exam room. Twenty to thirty minutes later, it was down to 138/70. Still on the Losartan 100 mg qd, Norvasc 10 mg qd and Chlorthalidone 25 mg qd. Reminded him to restrict caffeine and salt intake. Will get follow up labs the end of July 2020 and recheck pending lab reports. - losartan (COZAAR) 100 MG tablet; Take 1 tablet (100 mg total) by mouth daily.  Dispense: 90 tablet; Refill: 3 - chlorthalidone (HYGROTON) 25 MG tablet; Take 1 tablet (25 mg total) by mouth daily.  Dispense: 90 tablet; Refill: 3 - amLODipine (NORVASC) 10 MG tablet; 1 TABLET BY MOUTH ONCE DAILY  Dispense: 90 tablet; Refill: 3 - CBC with Differential/Platelet; Future - Comprehensive metabolic panel; Future - Lipid panel; Future - TSH; Future  2. Combined fat and carbohydrate induced hyperlipemia Still tolerating the Omega-3 Fish Oil and trying to follow a low fat diet. Has regained 3 lbs in the past year. Will recheck labs and encouraged to maintain better control of diet and weight. Recheck labs and follow up pending reports. - Comprehensive metabolic panel; Future - Lipid panel; Future - TSH; Future

## 2018-11-27 ENCOUNTER — Other Ambulatory Visit: Payer: Self-pay

## 2018-11-27 ENCOUNTER — Ambulatory Visit (INDEPENDENT_AMBULATORY_CARE_PROVIDER_SITE_OTHER): Payer: 59 | Admitting: Family Medicine

## 2018-11-27 ENCOUNTER — Encounter: Payer: Self-pay | Admitting: Family Medicine

## 2018-11-27 VITALS — BP 140/76 | HR 82 | Temp 98.8°F | Ht 69.0 in | Wt 223.0 lb

## 2018-11-27 DIAGNOSIS — I10 Essential (primary) hypertension: Secondary | ICD-10-CM

## 2018-11-27 DIAGNOSIS — E782 Mixed hyperlipidemia: Secondary | ICD-10-CM | POA: Diagnosis not present

## 2018-11-27 DIAGNOSIS — Z125 Encounter for screening for malignant neoplasm of prostate: Secondary | ICD-10-CM | POA: Diagnosis not present

## 2018-11-27 DIAGNOSIS — Z0000 Encounter for general adult medical examination without abnormal findings: Secondary | ICD-10-CM

## 2018-11-27 NOTE — Progress Notes (Signed)
Patient: Randy Ingram, Male    DOB: 01-Feb-1956, 63 y.o.   MRN: [id number] Visit Date: 11/27/2018  Today's Provider: Vernie Murders, PA   Chief Complaint  Patient presents with  . Annual Exam   Subjective:  Randy Ingram is a 63 y.o. male who presents today for health maintenance and complete physical. He feels well. He reports exercising 3 times weekly. He reports he is sleeping well.  Review of Systems  Constitutional: Negative.   HENT: Negative.   Eyes: Negative.   Respiratory: Negative.   Cardiovascular: Negative.   Gastrointestinal: Negative.   Endocrine: Negative.   Genitourinary: Negative.   Musculoskeletal: Negative.   Skin: Negative.   Allergic/Immunologic: Negative.   Neurological: Negative.   Hematological: Negative.   Psychiatric/Behavioral: Negative.    Social History   Socioeconomic History  . Marital status: Married    Spouse name: Not on file  . Number of children: Not on file  . Years of education: Not on file  . Highest education level: Not on file  Occupational History  . Not on file  Social Needs  . Financial resource strain: Not on file  . Food insecurity    Worry: Not on file    Inability: Not on file  . Transportation needs    Medical: Not on file    Non-medical: Not on file  Tobacco Use  . Smoking status: Never Smoker  . Smokeless tobacco: Never Used  Substance and Sexual Activity  . Alcohol use: No    Alcohol/week: 0.0 standard drinks  . Drug use: No  . Sexual activity: Not on file  Lifestyle  . Physical activity    Days per week: Not on file    Minutes per session: Not on file  . Stress: Not on file  Relationships  . Social Herbalist on phone: Not on file    Gets together: Not on file    Attends religious service: Not on file    Active member of club or organization: Not on file    Attends meetings of clubs or organizations: Not on file    Relationship status: Not on file  . Intimate partner violence    Fear  of current or ex partner: Not on file    Emotionally abused: Not on file    Physically abused: Not on file    Forced sexual activity: Not on file  Other Topics Concern  . Not on file  Social History Narrative  . Not on file   Patient Active Problem List   Diagnosis Date Noted  . Special screening for malignant neoplasms, colon   . Benign neoplasm of sigmoid colon   . 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 BP (high blood pressure) 08/23/2014  . Essential (primary) hypertension 06/27/2007   Past Surgical History:  Procedure Laterality Date  . COLONOSCOPY WITH PROPOFOL N/A 02/28/2015   Procedure: COLONOSCOPY WITH PROPOFOL;  Surgeon: Lucilla Lame, MD;  Location: [facility];  Service: Endoscopy;  Laterality: N/A;   His family history includes Cancer in his father; Heart disease in his maternal grandfather; Hypertension in his sister; Parkinson's disease in his brother; Stroke in his brother.     Allergies  Allergen Reactions  . Lidocaine     Other reaction(s): Dizzyness, Unconsciousness   Outpatient Encounter Medications as of 11/27/2018  Medication Sig  . amLODipine (NORVASC) 10 MG tablet 1 TABLET BY MOUTH ONCE DAILY  . B COMPLEX VITAMINS PO 1 tablet  daily. am  . chlorthalidone (HYGROTON) 25 MG tablet Take 1 tablet (25 mg total) by mouth daily.  . Cinnamon 500 MG capsule Take 500 mg by mouth daily.  Javier Docker Oil 1000 MG CAPS Take by mouth.  . losartan (COZAAR) 100 MG tablet Take 1 tablet (100 mg total) by mouth daily.  . OMEGA-3 FATTY ACIDS PO Take 1,000 mg by mouth daily. am  . OVER THE COUNTER MEDICATION   . [DISCONTINUED] psyllium (REGULOID) 0.52 G capsule Take 10 capsules by mouth daily.   No facility-administered encounter medications on file as of 11/27/2018.    Patient Care Team: Arbutus Nelligan, Vickki Muff, PA as PCP - General (Physician Assistant)     Objective:   Vitals:  Vitals:   11/27/18 1343  BP: 140/76  Pulse: 82  Temp: 98.8 F  (37.1 C)  TempSrc: Oral  SpO2: 96%  Weight: 223 lb (101.2 kg)  Height: 5\' 9"  (1.753 m)   Wt Readings from Last 3 Encounters:  11/27/18 223 lb (101.2 kg)  10/17/18 222 lb (100.7 kg)  11/21/17 219 lb (99.3 kg)    Physical Exam Constitutional:      General: He is not in acute distress.    Appearance: He is well-developed.  HENT:     Head: Normocephalic and atraumatic.     Right Ear: Hearing, tympanic membrane and external ear normal.     Left Ear: Hearing, tympanic membrane and external ear normal.     Nose: Nose normal.  Eyes:     General: Lids are normal. No scleral icterus.       Right eye: No discharge.        Left eye: No discharge.     Conjunctiva/sclera: Conjunctivae normal.     Pupils: Pupils are equal, round, and reactive to light.  Neck:     Musculoskeletal: Normal range of motion and neck supple.     Thyroid: No thyromegaly.     Trachea: No tracheal deviation.  Cardiovascular:     Rate and Rhythm: Normal rate and regular rhythm.     Heart sounds: Normal heart sounds. No murmur.  Pulmonary:     Effort: Pulmonary effort is normal. No respiratory distress.     Breath sounds: Normal breath sounds. No wheezing or rales.  Chest:     Chest wall: No tenderness.  Abdominal:     General: Bowel sounds are normal. There is no distension.     Palpations: Abdomen is soft. There is no mass.     Tenderness: There is no abdominal tenderness. There is no guarding or rebound.  Musculoskeletal: Normal range of motion.        General: No tenderness.  Lymphadenopathy:     Cervical: No cervical adenopathy.  Skin:    General: Skin is warm and dry.     Findings: No erythema, lesion or rash.  Neurological:     General: No focal deficit present.     Mental Status: He is alert and oriented to person, place, and time.     Cranial Nerves: No cranial nerve deficit.     Motor: No abnormal muscle tone.     Coordination: Coordination normal.     Deep Tendon Reflexes: Reflexes are  normal and symmetric. Reflexes normal.  Psychiatric:        Speech: Speech normal.        Behavior: Behavior normal.        Thought Content: Thought content normal.  Judgment: Judgment normal.     Fall Risk  11/27/2018 11/08/2017 11/08/2017 10/28/2016 09/23/2014  Falls in the past year? 0 No No No No  Number falls in past yr: 0 - - - -  Injury with Fall? 0 - - - -  Follow up Falls evaluation completed - - - -   Depression Screen PHQ 2/9 Scores 11/27/2018 11/08/2017 11/08/2017 10/28/2016  PHQ - 2 Score 0 0 0 0     Office Visit from 11/27/2018 in [place]  AUDIT-C Score  0     Functional Status Survey: Is the patient deaf or have difficulty hearing?: No Does the patient have difficulty seeing, even when wearing glasses/contacts?: No Does the patient have difficulty concentrating, remembering, or making decisions?: No Does the patient have difficulty walking or climbing stairs?: No Does the patient have difficulty dressing or bathing?: No Does the patient have difficulty doing errands alone such as visiting a doctor's office or shopping?: No  Assessment & Plan:     Routine Health Maintenance and Physical Exam  Exercise Activities and Dietary recommendations Goals   Walk 20 minutes 3-4 days a week. Recommend 4-6 glasses of water daily.     Immunization History  Administered Date(s) Administered  . Influenza Split 01/14/2014  . Influenza-Unspecified 01/25/2017  . Pneumococcal Polysaccharide-23 01/07/2012  . Tdap 01/18/2011, 01/07/2012   Health Maintenance  Topic Date Due  . INFLUENZA VACCINE  11/18/2018  . TETANUS/TDAP  01/06/2022  . COLONOSCOPY  02/27/2025  . Hepatitis C Screening  Completed  . HIV Screening  Completed   Discussed health benefits of physical activity, and encouraged him to engage in regular exercise appropriate for his age and condition.   1. Annual physical exam Good general health. Immunizations up to date. Had colonoscopy in  2016 with some benign polyps.  - CBC with Differential/Platelet - Comprehensive metabolic panel - Lipid panel - TSH - PSA  2. Combined fat and carbohydrate induced hyperlipemia Tolerating Krill oil 1000 mg qd with low fat diet. Has gained 4 lbs in the past year. Will recheck labs and follow up pending reports. - Comprehensive metabolic panel - Lipid panel - TSH  3. Essential (primary) hypertension Stable BP on the Amlodipine 10 mg qd, Losartan 100 mg qd and Chlorthalidone 25 mg qd. Continues to restrict salt in diet. Will recheck CBC, CMP and TSH. Recheck pending reports. - CBC with Differential/Platelet - Comprehensive metabolic panel - TSH  4. Screening PSA (prostate specific antigen) Asymptomatic. Father had prostate disease with bone cancer at his death. - PSA

## 2018-11-30 LAB — COMPREHENSIVE METABOLIC PANEL
ALT: 22 IU/L (ref 0–44)
AST: 25 IU/L (ref 0–40)
Albumin/Globulin Ratio: 1.9 (ref 1.2–2.2)
Albumin: 4.5 g/dL (ref 3.8–4.8)
Alkaline Phosphatase: 82 IU/L (ref 39–117)
BUN/Creatinine Ratio: 21 (ref 10–24)
BUN: 19 mg/dL (ref 8–27)
Bilirubin Total: 0.5 mg/dL (ref 0.0–1.2)
CO2: 31 mmol/L — ABNORMAL HIGH (ref 20–29)
Calcium: 9.3 mg/dL (ref 8.6–10.2)
Chloride: 102 mmol/L (ref 96–106)
Creatinine, Ser: 0.92 mg/dL (ref 0.76–1.27)
GFR calc Af Amer: 103 mL/min/{1.73_m2} (ref >59)
GFR calc non Af Amer: 89 mL/min/{1.73_m2} (ref >59)
Globulin, Total: 2.4 g/dL (ref 1.5–4.5)
Glucose: 96 mg/dL (ref 65–99)
Potassium: 3.4 mmol/L — ABNORMAL LOW (ref 3.5–5.2)
Sodium: 146 mmol/L — ABNORMAL HIGH (ref 134–144)
Total Protein: 6.9 g/dL (ref 6.0–8.5)

## 2018-11-30 LAB — PSA: Prostate Specific Ag, Serum: 1.3 ng/mL (ref 0.0–4.0)

## 2018-11-30 LAB — CBC WITH DIFFERENTIAL/PLATELET
Basophils Absolute: 0 10*3/uL (ref 0.0–0.2)
Basos: 1 %
EOS (ABSOLUTE): 0.3 10*3/uL (ref 0.0–0.4)
Eos: 5 %
Hematocrit: 48.2 % (ref 37.5–51.0)
Hemoglobin: 16.3 g/dL (ref 13.0–17.7)
Immature Grans (Abs): 0 10*3/uL (ref 0.0–0.1)
Immature Granulocytes: 0 %
Lymphocytes Absolute: 2 10*3/uL (ref 0.7–3.1)
Lymphs: 28 %
MCH: 31.1 pg (ref 26.6–33.0)
MCHC: 33.8 g/dL (ref 31.5–35.7)
MCV: 92 fL (ref 79–97)
Monocytes Absolute: 0.5 10*3/uL (ref 0.1–0.9)
Monocytes: 7 %
Neutrophils Absolute: 4.4 10*3/uL (ref 1.4–7.0)
Neutrophils: 59 %
Platelets: 272 10*3/uL (ref 150–450)
RBC: 5.24 x10E6/uL (ref 4.14–5.80)
RDW: 13.1 % (ref 11.6–15.4)
WBC: 7.3 10*3/uL (ref 3.4–10.8)

## 2018-11-30 LAB — LIPID PANEL
Chol/HDL Ratio: 4.4 ratio (ref 0.0–5.0)
Cholesterol, Total: 198 mg/dL (ref 100–199)
HDL: 45 mg/dL (ref >39)
LDL Calculated: 128 mg/dL — ABNORMAL HIGH (ref 0–99)
Triglycerides: 123 mg/dL (ref 0–149)
VLDL Cholesterol Cal: 25 mg/dL (ref 5–40)

## 2018-11-30 LAB — TSH: TSH: 1.76 u[IU]/mL (ref 0.450–4.500)

## 2018-12-01 ENCOUNTER — Telehealth: Payer: Self-pay

## 2018-12-01 NOTE — Telephone Encounter (Signed)
Advised patient of results.  

## 2018-12-01 NOTE — Telephone Encounter (Signed)
Left message for patient to call back about lab results.

## 2018-12-01 NOTE — Telephone Encounter (Signed)
-----   Message from [place], [place] sent at 11/30/2018  5:40 PM EDT ----- Blood tests and PSA test normal except LDL above goal of <100 and slight decrease in potassium. Recommend potassium rich foods and less salt use in diet. Restrict fat intake and recommend walking for exercise 30-40 minutes daily. Recheck fasting labs in 3 months.

## 2019-11-08 ENCOUNTER — Other Ambulatory Visit: Payer: Self-pay | Admitting: Family Medicine

## 2019-11-08 DIAGNOSIS — I10 Essential (primary) hypertension: Secondary | ICD-10-CM

## 2019-11-08 NOTE — Telephone Encounter (Signed)
Requested medication (s) are due for refill today: yes  Requested medication (s) are on the active medication list: yes  Last refill:  10/11/2019  Future visit scheduled: no  Notes to clinic:  Patient due for labs and follow up   Requested Prescriptions  Pending Prescriptions Disp Refills   losartan (COZAAR) 100 MG tablet [Pharmacy Med Name: LOSARTAN POTASSIUM 100 MG TAB] 180 tablet     Sig: TAKE 1 TABLET BY MOUTH ONCE DAILY (100 MG)      Cardiovascular:  Angiotensin Receptor Blockers Failed - 11/08/2019 10:19 AM      Failed - Cr in normal range and within 180 days    Creatinine, Ser  Date Value Ref Range Status  11/29/2018 0.92 0.76 - 1.27 mg/dL Final          Failed - K in normal range and within 180 days    Potassium  Date Value Ref Range Status  11/29/2018 3.4 (L) 3.5 - 5.2 mmol/L Final          Failed - Last BP in normal range    BP Readings from Last 1 Encounters:  11/27/18 140/76          Failed - Valid encounter within last 6 months    Recent Outpatient Visits           11 months ago Annual physical exam   [facility], Vickki Muff, [place]   1 year ago Essential (primary) hypertension   [facility], Vickki Muff, [place]   1 year ago Essential (primary) hypertension   [facility], [place], [place]   2 years ago Annual physical exam   [place], [place]   2 years ago Essential (primary) hypertension   [facility], Vickki Muff, [place]              Passed - Patient is not pregnant

## 2019-11-08 NOTE — Telephone Encounter (Signed)
Call to patient- appointment scheduled for CPE- which is what he needs- Rx courtesy RF given

## 2020-01-01 ENCOUNTER — Other Ambulatory Visit: Payer: Self-pay

## 2020-01-01 ENCOUNTER — Ambulatory Visit (INDEPENDENT_AMBULATORY_CARE_PROVIDER_SITE_OTHER): Payer: 59 | Admitting: Family Medicine

## 2020-01-01 VITALS — BP 160/90 | HR 83 | Temp 98.3°F | Ht 68.75 in | Wt 225.0 lb

## 2020-01-01 DIAGNOSIS — I10 Essential (primary) hypertension: Secondary | ICD-10-CM

## 2020-01-01 DIAGNOSIS — E782 Mixed hyperlipidemia: Secondary | ICD-10-CM

## 2020-01-01 DIAGNOSIS — Z23 Encounter for immunization: Secondary | ICD-10-CM

## 2020-01-01 DIAGNOSIS — Z0000 Encounter for general adult medical examination without abnormal findings: Secondary | ICD-10-CM | POA: Diagnosis not present

## 2020-01-01 DIAGNOSIS — M25461 Effusion, right knee: Secondary | ICD-10-CM

## 2020-01-01 DIAGNOSIS — Z125 Encounter for screening for malignant neoplasm of prostate: Secondary | ICD-10-CM

## 2020-01-01 NOTE — Progress Notes (Signed)
Complete physical exam   Patient: Randy Ingram   DOB: January 04, 1956   64 y.o. Male  MRN: [id number] Visit Date: 01/01/2020  Today's healthcare provider: Vernie Murders, PA   Chief Complaint  Patient presents with   Annual Exam   Subjective    Randy Ingram is a 64 y.o. male who presents today for a complete physical exam.  He reports consuming a general diet. The patient has a physically strenuous job, but has no regular exercise apart from work.  He generally feels well. He reports sleeping well. He does have additional problems to discuss today.   HPI   Patient is complaining of some right knee swelling.  He states it does not hurt and it comes and goes.  It seems to flare after he had been doing some work where he had to be on his knees.  He has had 2 episodes over the last several months.    Past Medical History:  Diagnosis Date   Hypertension    controlled on meds   Past Surgical History:  Procedure Laterality Date   COLONOSCOPY WITH PROPOFOL N/A 02/28/2015   Procedure: COLONOSCOPY WITH PROPOFOL;  Surgeon: Lucilla Lame, MD;  Location: [place];  Service: Endoscopy;  Laterality: N/A;   Social History   Socioeconomic History   Marital status: Married    Spouse name: Not on file   Number of children: Not on file   Years of education: Not on file   Highest education level: Not on file  Occupational History   Not on file  Tobacco Use   Smoking status: Never Smoker   Smokeless tobacco: Never Used  Substance and Sexual Activity   Alcohol use: No    Alcohol/week: 0.0 standard drinks   Drug use: No   Sexual activity: Not on file  Other Topics Concern   Not on file  Social History Narrative   Not on file   Social Determinants of Health   Financial Resource Strain:    Difficulty of Paying Living Expenses: Not on file  Food Insecurity:    Worried About [place] in the Last Year: Not on file   Ran Out of Food in the Last Year: Not on  file  Transportation Needs:    Lack of Transportation (Medical): Not on file   Lack of Transportation (Non-Medical): Not on file  Physical Activity:    Days of Exercise per Week: Not on file   Minutes of Exercise per Session: Not on file  Stress:    Feeling of Stress : Not on file  Social Connections:    Frequency of Communication with Friends and Family: Not on file   Frequency of Social Gatherings with Friends and Family: Not on file   Attends Religious Services: Not on file   Active Member of Clubs or Organizations: Not on file   Attends Archivist Meetings: Not on file   Marital Status: Not on file  Intimate Partner Violence:    Fear of Current or Ex-Partner: Not on file   Emotionally Abused: Not on file   Physically Abused: Not on file   Sexually Abused: Not on file   Family Status  Relation Name Status   Father  Deceased   Sister 23 Alive   Brother 1 Alive   MGF  Deceased   Brother 2 Alive   Mother  Deceased   PGM  Deceased   Sister 2 Alive   Sister 3  Alive   Sister 4 Alive   Sister 5 Alive   Brother 3 Alive   Brother 4 Alive   MGM  Deceased   PGF  Deceased   Sister 6 Alive   Family History  Problem Relation Age of Onset   Cancer Father    Hypertension Sister    Stroke Brother    Heart disease Maternal Grandfather    Parkinson's disease Brother    Allergies  Allergen Reactions   Lidocaine     Other reaction(s): Dizzyness, Unconsciousness    Patient Care Team: Lyan Moyano, Vickki Muff, PA as PCP - General (Physician Assistant)   Medications: Outpatient Medications Prior to Visit  Medication Sig   amLODipine (NORVASC) 10 MG tablet TAKE 1 TABLET BY MOUTH ONCE DAILY   B COMPLEX VITAMINS PO 1 tablet daily. am   chlorthalidone (HYGROTON) 25 MG tablet TAKE 1 TABLET BY MOUTH ONCE DAILY   Cinnamon 500 MG capsule Take 500 mg by mouth daily.   Krill Oil 1000 MG CAPS Take by mouth.   losartan (COZAAR) 100 MG tablet TAKE 1 TABLET BY MOUTH ONCE DAILY (100  MG)   OMEGA-3 FATTY ACIDS PO Take 1,000 mg by mouth daily. am   OVER THE COUNTER MEDICATION    No facility-administered medications prior to visit.    Review of Systems  Constitutional: Negative.   HENT: Negative.   Eyes: Negative.   Respiratory: Negative.   Cardiovascular: Negative.   Gastrointestinal: Negative.   Endocrine: Negative.   Genitourinary: Negative.   Musculoskeletal: Positive for joint swelling (knee).  Skin: Negative.   Allergic/Immunologic: Negative.   Neurological: Negative.   Hematological: Negative.   Psychiatric/Behavioral: Negative.       Objective    BP (!) 160/90 (BP Location: Right Arm, Patient Position: Sitting, Cuff Size: Normal)    Pulse 83    Temp 98.3 F (36.8 C) (Oral)    Ht 5' 8.75" (1.746 m)    Wt 225 lb (102.1 kg)    SpO2 99%    BMI 33.47 kg/m  BP Readings from Last 3 Encounters:  01/01/20 (!) 160/90  11/27/18 140/76  10/17/18 138/70   Wt Readings from Last 3 Encounters:  01/01/20 225 lb (102.1 kg)  11/27/18 223 lb (101.2 kg)  10/17/18 222 lb (100.7 kg)   Physical Exam Constitutional:      Appearance: Normal appearance. He is normal weight.  HENT:     Head: Normocephalic and atraumatic.     Right Ear: Tympanic membrane, ear canal and external ear normal.     Left Ear: Tympanic membrane, ear canal and external ear normal.     Nose: Nose normal.     Mouth/Throat:     Mouth: Mucous membranes are moist.     Pharynx: Oropharynx is clear.  Eyes:     Extraocular Movements: Extraocular movements intact.     Conjunctiva/sclera: Conjunctivae normal.     Pupils: Pupils are equal, round, and reactive to light.  Cardiovascular:     Rate and Rhythm: Normal rate and regular rhythm.     Pulses: Normal pulses.     Heart sounds: Normal heart sounds.  Pulmonary:     Effort: Pulmonary effort is normal.     Breath sounds: Normal breath sounds.  Abdominal:     General: Abdomen is flat. Bowel sounds are normal.     Palpations: Abdomen is soft.   Genitourinary:    Penis: Normal.      Prostate: Normal.  Rectum: Normal. Guaiac result negative.  Musculoskeletal:        General: No swelling. Normal range of motion.     Cervical back: Normal range of motion and neck supple.  Skin:    General: Skin is warm and dry.  Neurological:     General: No focal deficit present.     Mental Status: He is alert and oriented to person, place, and time. Mental status is at baseline.  Psychiatric:        Mood and Affect: Mood normal.        Behavior: Behavior normal.        Thought Content: Thought content normal.        Judgment: Judgment normal.      Last depression screening scores PHQ 2/9 Scores 01/01/2020 11/27/2018 11/08/2017  PHQ - 2 Score 0 0 0  PHQ- 9 Score 0 - -   Last fall risk screening Fall Risk  01/01/2020  Falls in the past year? 0  Number falls in past yr: 0  Injury with Fall? 1  Follow up -   Last Audit-C alcohol use screening Alcohol Use Disorder Test (AUDIT) 01/01/2020  1. How often do you have a drink containing alcohol? 0  2. How many drinks containing alcohol do you have on a typical day when you are drinking? 0  3. How often do you have six or more drinks on one occasion? 0  AUDIT-C Score 0  Alcohol Brief Interventions/Follow-up AUDIT Score <7 follow-up not indicated   A score of 3 or more in women, and 4 or more in men indicates increased risk for alcohol abuse, EXCEPT if all of the points are from question 1   No results found for any visits on 01/01/20.  Assessment & Plan    Routine Health Maintenance and Physical Exam  Exercise Activities and Dietary recommendations Goals   Continue low fat low salt DASH diet. Exercise 30-40 minutes 3-4 times a week     Immunization History  Administered Date(s) Administered   Influenza Split 01/14/2014   Influenza-Unspecified 01/25/2017, 01/17/2018   Pneumococcal Polysaccharide-23 01/07/2012   Tdap 01/18/2011, 01/07/2012    Health Maintenance  Topic Date Due    COVID-19 Vaccine (1) Never done   INFLUENZA VACCINE  11/18/2019   TETANUS/TDAP  01/06/2022   COLONOSCOPY  02/27/2025   Hepatitis C Screening  Completed   HIV Screening  Completed    Discussed health benefits of physical activity, and encouraged him to engage in regular exercise appropriate for his age and condition.  1. Annual physical exam Good general health. Counseled regarding need for weight loss and continued exercise. Increase water intake and get routine labs. - CBC with Differential/Platelet - Comprehensive metabolic panel - Lipid panel - PSA - TSH  2. Combined fat and carbohydrate induced hyperlipemia Total cholesterol was 198 with LDL 128 last year. Prefers to work on lifestyle changes instead of starting another medication. Recheck CMP and Lipid Panel. - Comprehensive metabolic panel - Lipid panel  3. Essential (primary) hypertension BP frequently elevated in the office and back down at home. Presently on [place] 25 mg qd, Amlodipine 10 mg qd and Losartan 100 mg qd. Reminded him to restrict salt, caffeine and ETOH intake. Recheck labs. Also, reminded him that a little weight loss may help this control, more. - CBC with Differential/Platelet - Comprehensive metabolic panel - TSH  4. Screening PSA (prostate specific antigen) - PSA  5. Swelling of joint of right knee Onset after crawling  around on some rocks a couple weeks ago. Resolved after using an ACE wrap for a few days. No swelling today. - Sedimentation rate - CBC with Differential/Platelet  6. Needs flu shot States he had the J&J COVID vaccination "several months ago" at a [facility] pharmacy. Recommend he also get an influenza shot.  No follow-ups on file.     I, Avyon Herendeen, PA-C, have reviewed all documentation for this visit. The documentation on 01/02/21 for the exam, diagnosis, procedures, and orders are all accurate and complete.    Vernie Murders, [place] [phone]  (phone) [phone] (fax)  [place]

## 2020-01-02 LAB — CBC WITH DIFFERENTIAL/PLATELET
Basophils Absolute: 0.1 10*3/uL (ref 0.0–0.2)
Basos: 1 %
EOS (ABSOLUTE): 0.2 10*3/uL (ref 0.0–0.4)
Eos: 3 %
Hematocrit: 48.8 % (ref 37.5–51.0)
Hemoglobin: 16.8 g/dL (ref 13.0–17.7)
Immature Grans (Abs): 0 10*3/uL (ref 0.0–0.1)
Immature Granulocytes: 0 %
Lymphocytes Absolute: 2.1 10*3/uL (ref 0.7–3.1)
Lymphs: 26 %
MCH: 31.6 pg (ref 26.6–33.0)
MCHC: 34.4 g/dL (ref 31.5–35.7)
MCV: 92 fL (ref 79–97)
Monocytes Absolute: 0.6 10*3/uL (ref 0.1–0.9)
Monocytes: 8 %
Neutrophils Absolute: 5.2 10*3/uL (ref 1.4–7.0)
Neutrophils: 62 %
Platelets: 273 10*3/uL (ref 150–450)
RBC: 5.32 x10E6/uL (ref 4.14–5.80)
RDW: 13.3 % (ref 11.6–15.4)
WBC: 8.3 10*3/uL (ref 3.4–10.8)

## 2020-01-02 LAB — COMPREHENSIVE METABOLIC PANEL
ALT: 22 IU/L (ref 0–44)
AST: 21 IU/L (ref 0–40)
Albumin/Globulin Ratio: 1.5 (ref 1.2–2.2)
Albumin: 4.5 g/dL (ref 3.8–4.8)
Alkaline Phosphatase: 94 IU/L (ref 44–121)
BUN/Creatinine Ratio: 13 (ref 10–24)
BUN: 13 mg/dL (ref 8–27)
Bilirubin Total: 0.6 mg/dL (ref 0.0–1.2)
CO2: 28 mmol/L (ref 20–29)
Calcium: 9.7 mg/dL (ref 8.6–10.2)
Chloride: 100 mmol/L (ref 96–106)
Creatinine, Ser: 1.04 mg/dL (ref 0.76–1.27)
GFR calc Af Amer: 89 mL/min/{1.73_m2} (ref >59)
GFR calc non Af Amer: 77 mL/min/{1.73_m2} (ref >59)
Globulin, Total: 3 g/dL (ref 1.5–4.5)
Glucose: 95 mg/dL (ref 65–99)
Potassium: 3.4 mmol/L — ABNORMAL LOW (ref 3.5–5.2)
Sodium: 145 mmol/L — ABNORMAL HIGH (ref 134–144)
Total Protein: 7.5 g/dL (ref 6.0–8.5)

## 2020-01-02 LAB — LIPID PANEL
Chol/HDL Ratio: 4.3 ratio (ref 0.0–5.0)
Cholesterol, Total: 197 mg/dL (ref 100–199)
HDL: 46 mg/dL (ref >39)
LDL Chol Calc (NIH): 119 mg/dL — ABNORMAL HIGH (ref 0–99)
Triglycerides: 181 mg/dL — ABNORMAL HIGH (ref 0–149)
VLDL Cholesterol Cal: 32 mg/dL (ref 5–40)

## 2020-01-02 LAB — TSH: TSH: 1.86 u[IU]/mL (ref 0.450–4.500)

## 2020-01-02 LAB — SEDIMENTATION RATE: Sed Rate: 7 mm/hr (ref 0–30)

## 2020-01-02 LAB — PSA: Prostate Specific Ag, Serum: 1.8 ng/mL (ref 0.0–4.0)

## 2020-01-03 ENCOUNTER — Other Ambulatory Visit: Payer: Self-pay | Admitting: Family Medicine

## 2020-01-03 DIAGNOSIS — I10 Essential (primary) hypertension: Secondary | ICD-10-CM

## 2020-02-14 ENCOUNTER — Other Ambulatory Visit: Payer: Self-pay | Admitting: Family Medicine

## 2020-02-14 DIAGNOSIS — I10 Essential (primary) hypertension: Secondary | ICD-10-CM

## 2020-05-14 ENCOUNTER — Other Ambulatory Visit: Payer: Self-pay | Admitting: Family Medicine

## 2020-05-14 DIAGNOSIS — I10 Essential (primary) hypertension: Secondary | ICD-10-CM

## 2020-06-16 ENCOUNTER — Ambulatory Visit: Payer: Self-pay | Admitting: Family Medicine

## 2020-07-22 ENCOUNTER — Ambulatory Visit: Payer: Self-pay | Admitting: Family Medicine

## 2020-08-18 ENCOUNTER — Ambulatory Visit (INDEPENDENT_AMBULATORY_CARE_PROVIDER_SITE_OTHER): Payer: 59 | Admitting: Family Medicine

## 2020-08-18 ENCOUNTER — Other Ambulatory Visit: Payer: Self-pay

## 2020-08-18 ENCOUNTER — Encounter: Payer: Self-pay | Admitting: Family Medicine

## 2020-08-18 VITALS — BP 158/88 | HR 68 | Temp 98.6°F | Wt 233.0 lb

## 2020-08-18 DIAGNOSIS — I10 Essential (primary) hypertension: Secondary | ICD-10-CM | POA: Diagnosis not present

## 2020-08-18 MED ORDER — CLONIDINE HCL 0.1 MG PO TABS: 0.1000 mg | ORAL_TABLET | Freq: Two times a day (BID) | ORAL | 3 refills | Status: DC

## 2020-08-18 NOTE — Progress Notes (Signed)
Established patient visit   Patient: Randy Ingram   DOB: March 07, 1956   65 y.o. Male  MRN: [id number] Visit Date: 08/18/2020  Today's healthcare provider: Vernie Murders, PA-C   No chief complaint on file.  Subjective    HPI  Hypertension, follow-up  BP Readings from Last 3 Encounters:  08/18/20 (!) 158/88  01/01/20 (!) 160/90  11/27/18 140/76   Wt Readings from Last 3 Encounters:  08/18/20 233 lb (105.7 kg)  01/01/20 225 lb (102.1 kg)  11/27/18 223 lb (101.2 kg)     He was last seen for hypertension 8 months ago.  BP at that visit was as above. Management since that visit includes none.  He reports excellent compliance with treatment. He is not having side effects.  He is following a Regular diet. He is not exercising. He does not smoke.  Use of agents associated with hypertension: none.   Outside blood pressures are not checked. Symptoms: No chest pain No chest pressure  No palpitations No syncope  No dyspnea No orthopnea  No paroxysmal nocturnal dyspnea No lower extremity edema   Pertinent labs: Lab Results  Component Value Date   CHOL 197 01/01/2020   HDL 46 01/01/2020   LDLCALC 119 (H) 01/01/2020   TRIG 181 (H) 01/01/2020   CHOLHDL 4.3 01/01/2020   Lab Results  Component Value Date   NA 145 (H) 01/01/2020   K 3.4 (L) 01/01/2020   CREATININE 1.04 01/01/2020   GFRNONAA 77 01/01/2020   GFRAA 89 01/01/2020   GLUCOSE 95 01/01/2020     The 10-year ASCVD risk score Mikey Bussing DC Jr., et al., 2013) is: 18.7%   --------------------------------------------------------------------------------------------------- Past Medical History:  Diagnosis Date  . Hypertension    controlled on meds   Past Surgical History:  Procedure Laterality Date  . COLONOSCOPY WITH PROPOFOL N/A 02/28/2015   Procedure: COLONOSCOPY WITH PROPOFOL;  Surgeon: Lucilla Lame, MD;  Location: [place] [facility];  Service: Endoscopy;  Laterality: N/A;   Family History  Problem  Relation Age of Onset  . Cancer Father   . Hypertension Sister   . Stroke Brother   . Heart disease Maternal Grandfather   . Parkinson's disease Brother    Social History   Tobacco Use  . Smoking status: Never Smoker  . Smokeless tobacco: Never Used  Substance Use Topics  . Alcohol use: No    Alcohol/week: 0.0 standard drinks  . Drug use: No   Allergies  Allergen Reactions  . Lidocaine     Other reaction(s): Dizzyness, Unconsciousness   Medications: Outpatient Medications Prior to Visit  Medication Sig  . amLODipine (NORVASC) 10 MG tablet TAKE 1 TABLET BY MOUTH ONCE DAILY  . B COMPLEX VITAMINS PO 1 tablet daily. am  . chlorthalidone (HYGROTON) 25 MG tablet TAKE 1 TABLET BY MOUTH ONCE DAILY  . Cinnamon 500 MG capsule Take 500 mg by mouth daily.  Javier Docker Oil 1000 MG CAPS Take by mouth.  . losartan (COZAAR) 100 MG tablet TAKE 1 TABLET BY MOUTH ONCE DAILY (100 MG)  . OMEGA-3 FATTY ACIDS PO Take 1,000 mg by mouth daily. am  . OVER THE COUNTER MEDICATION    No facility-administered medications prior to visit.    Review of Systems      Objective    BP (!) 158/88 (BP Location: Right Arm, Patient Position: Sitting, Cuff Size: Normal)   Pulse 68   Temp 98.6 F (37 C) (Oral)   Wt 233 lb (105.7  kg)   SpO2 97%   BMI 34.66 kg/m    Wt Readings from Last 3 Encounters:  08/18/20 233 lb (105.7 kg)  01/01/20 225 lb (102.1 kg)  11/27/18 223 lb (101.2 kg)    Physical Exam Constitutional:      General: He is not in acute distress.    Appearance: He is well-developed.  HENT:     Head: Normocephalic and atraumatic.     Right Ear: Hearing normal.     Left Ear: Hearing normal.     Nose: Nose normal.  Eyes:     General: Lids are normal. No scleral icterus.       Right eye: No discharge.        Left eye: No discharge.     Conjunctiva/sclera: Conjunctivae normal.  Cardiovascular:     Rate and Rhythm: Normal rate and regular rhythm.     Heart sounds: Normal heart sounds.   Pulmonary:     Effort: Pulmonary effort is normal. No respiratory distress.     Breath sounds: Normal breath sounds.  Abdominal:     General: Bowel sounds are normal.     Palpations: Abdomen is soft.  Musculoskeletal:        General: Normal range of motion.     Cervical back: Neck supple.  Skin:    Findings: No lesion or rash.  Neurological:     Mental Status: He is alert and oriented to person, place, and time.  Psychiatric:        Speech: Speech normal.        Behavior: Behavior normal.        Thought Content: Thought content normal.       No results found for any visits on 08/18/20.  Assessment & Plan     1. Essential (primary) hypertension Still having hypertension. Taking Chlorthalidone 25 mg qd, Losartan 100 mg qd and Amlodipine 10 mg qd. Add Clonidine 0.1 mg 1-2 times a day and recheck in 4-6 weeks. - cloNIDine (CATAPRES) 0.1 MG tablet; Take 1 tablet (0.1 mg total) by mouth 2 (two) times daily.  Dispense: 60 tablet; Refill: 3   No follow-ups on file.      I, Jovani Flury, PA-C, have reviewed all documentation for this visit. The documentation on 08/18/20 for the exam, diagnosis, procedures, and orders are all accurate and complete.    Vernie Murders, PA-C  [facility] [phone] (phone) [phone] (fax)  [place]

## 2020-10-06 ENCOUNTER — Encounter: Payer: Self-pay | Admitting: Family Medicine

## 2020-10-06 ENCOUNTER — Ambulatory Visit (INDEPENDENT_AMBULATORY_CARE_PROVIDER_SITE_OTHER): Payer: 59 | Admitting: Family Medicine

## 2020-10-06 ENCOUNTER — Other Ambulatory Visit: Payer: Self-pay

## 2020-10-06 VITALS — BP 144/82 | HR 71 | Wt 232.0 lb

## 2020-10-06 DIAGNOSIS — I10 Essential (primary) hypertension: Secondary | ICD-10-CM

## 2020-10-06 NOTE — Progress Notes (Signed)
Established patient visit   Patient: Randy Ingram   DOB: 04/21/1955   65 y.o. Male  MRN: [id number] Visit Date: 10/06/2020  Today's healthcare provider: Vernie Murders, PA-C   Chief Complaint  Patient presents with   Hypertension   Subjective    HPI  Hypertension, follow-up  BP Readings from Last 3 Encounters:  10/06/20 (!) 144/82  08/18/20 (!) 158/88  01/01/20 (!) 160/90   Wt Readings from Last 3 Encounters:  10/06/20 232 lb (105.2 kg)  08/18/20 233 lb (105.7 kg)  01/01/20 225 lb (102.1 kg)     He was last seen for hypertension 1 months ago.  BP at that visit was as above. Management since that visit includes added Clonidine.  He reports good compliance with treatment. He is having side effects. Dry mouth He is following a Regular, Low Sodium diet. He is not exercising. He does not smoke.  Use of agents associated with hypertension: none.   Outside blood pressures are not being checked. Symptoms: No chest pain No chest pressure  No palpitations No syncope  No dyspnea No orthopnea  No paroxysmal nocturnal dyspnea No lower extremity edema   Pertinent labs: Lab Results  Component Value Date   CHOL 197 01/01/2020   HDL 46 01/01/2020   LDLCALC 119 (H) 01/01/2020   TRIG 181 (H) 01/01/2020   CHOLHDL 4.3 01/01/2020   Lab Results  Component Value Date   NA 145 (H) 01/01/2020   K 3.4 (L) 01/01/2020   CREATININE 1.04 01/01/2020   GFRNONAA 77 01/01/2020   GFRAA 89 01/01/2020   GLUCOSE 95 01/01/2020     The 10-year ASCVD risk score Mikey Bussing DC Jr., et al., 2013) is: 16.1%   --------------------------------------------------------------------------------------------------- Past Medical History:  Diagnosis Date   Hypertension    controlled on meds   Past Surgical History:  Procedure Laterality Date   COLONOSCOPY WITH PROPOFOL N/A 02/28/2015   Procedure: COLONOSCOPY WITH PROPOFOL;  Surgeon: Lucilla Lame, MD;  Location: [place];  Service:  Endoscopy;  Laterality: N/A;   Family History  Problem Relation Age of Onset   Cancer Father    Hypertension Sister    Stroke Brother    Heart disease Maternal Grandfather    Parkinson's disease Brother    Social History   Tobacco Use   Smoking status: Never   Smokeless tobacco: Never  Substance Use Topics   Alcohol use: No    Alcohol/week: 0.0 standard drinks   Drug use: No   Allergies  Allergen Reactions   Lidocaine     Other reaction(s): Dizzyness, Unconsciousness     Medications: Outpatient Medications Prior to Visit  Medication Sig   amLODipine (NORVASC) 10 MG tablet TAKE 1 TABLET BY MOUTH ONCE DAILY   B COMPLEX VITAMINS PO 1 tablet daily. am   chlorthalidone (HYGROTON) 25 MG tablet TAKE 1 TABLET BY MOUTH ONCE DAILY   Cinnamon 500 MG capsule Take 500 mg by mouth daily.   cloNIDine (CATAPRES) 0.1 MG tablet Take 1 tablet (0.1 mg total) by mouth 2 (two) times daily.   Krill Oil 1000 MG CAPS Take by mouth.   losartan (COZAAR) 100 MG tablet TAKE 1 TABLET BY MOUTH ONCE DAILY (100 MG)   OMEGA-3 FATTY ACIDS PO Take 1,000 mg by mouth daily. am   OVER THE COUNTER MEDICATION    No facility-administered medications prior to visit.    Review of Systems  All other systems reviewed and are negative.  Objective    BP (!) 144/82 (BP Location: Right Arm, Patient Position: Sitting, Cuff Size: Normal)   Pulse 71   Wt 232 lb (105.2 kg)   SpO2 97%   BMI 34.51 kg/m      Physical Exam Constitutional:      General: He is not in acute distress.    Appearance: He is well-developed.  HENT:     Head: Normocephalic and atraumatic.     Right Ear: Hearing normal.     Left Ear: Hearing normal.     Nose: Nose normal.  Eyes:     General: Lids are normal. No scleral icterus.       Right eye: No discharge.        Left eye: No discharge.     Conjunctiva/sclera: Conjunctivae normal.  Cardiovascular:     Rate and Rhythm: Normal rate and regular rhythm.  Pulmonary:      Effort: Pulmonary effort is normal. No respiratory distress.     Breath sounds: Normal breath sounds.  Abdominal:     General: Bowel sounds are normal.     Palpations: Abdomen is soft.  Musculoskeletal:        General: Normal range of motion.     Cervical back: Neck supple.  Skin:    Findings: No lesion or rash.  Neurological:     Mental Status: He is alert and oriented to person, place, and time.  Psychiatric:        Speech: Speech normal.        Behavior: Behavior normal.        Thought Content: Thought content normal.     No results found for any visits on 10/06/20.  Assessment & Plan     1. Essential (primary) hypertension Tolerating the Catapres with Amlodipine. Losartan and Chlorthalidone without side effects of significance. Manual recheck of BP today was 140/78 in the right arm. Will schedule for CPE in October 2022.    No follow-ups on file.      I, Keylah Darwish, PA-C, have reviewed all documentation for this visit. The documentation on 10/06/20 for the exam, diagnosis, procedures, and orders are all accurate and complete.    Vernie Murders, PA-C  [facility] [phone] (phone) [phone] (fax)  [place]

## 2020-11-06 ENCOUNTER — Other Ambulatory Visit: Payer: Self-pay | Admitting: Family Medicine

## 2020-11-06 DIAGNOSIS — I10 Essential (primary) hypertension: Secondary | ICD-10-CM

## 2020-12-15 ENCOUNTER — Other Ambulatory Visit: Payer: Self-pay | Admitting: Family Medicine

## 2020-12-15 DIAGNOSIS — I10 Essential (primary) hypertension: Secondary | ICD-10-CM

## 2020-12-15 NOTE — Telephone Encounter (Signed)
Requested medication (s) are due for refill today: yes  Requested medication (s) are on the active medication list: yes  Last refill:  11/08/19 #180 with 3 refills  Future visit scheduled: yes  Notes to clinic:  Please review for refill. Unable to refill per protocol. Last creatinine and potassium labs noted to be 01/01/20.     Requested Prescriptions  Pending Prescriptions Disp Refills   losartan (COZAAR) 100 MG tablet [Pharmacy Med Name: LOSARTAN POTASSIUM 100 MG TAB] 180 tablet 3    Sig: TAKE 1 TABLET BY MOUTH ONCE DAILY (100 MG)     Cardiovascular:  Angiotensin Receptor Blockers Failed - 12/15/2020 12:44 PM      Failed - Cr in normal range and within 180 days    Creatinine, Ser  Date Value Ref Range Status  01/01/2020 1.04 0.76 - 1.27 mg/dL Final          Failed - K in normal range and within 180 days    Potassium  Date Value Ref Range Status  01/01/2020 3.4 (L) 3.5 - 5.2 mmol/L Final          Failed - Last BP in normal range    BP Readings from Last 1 Encounters:  10/06/20 (!) 144/82          Passed - Patient is not pregnant      Passed - Valid encounter within last 6 months    Recent Outpatient Visits           2 months ago Essential (primary) hypertension   [facility], Vickki Muff, PA-C   3 months ago Essential (primary) hypertension   [facility], Vickki Muff, PA-C   11 months ago Annual physical exam   [facility], Vickki Muff, PA-C   2 years ago Annual physical exam   [facility], Vickki Muff, PA-C   2 years ago Essential (primary) hypertension   [facility], Vickki Muff, PA-C       Future Appointments             In 1 month [place], Vickki Muff, PA-C [facility], [place]

## 2021-01-23 ENCOUNTER — Encounter: Payer: 59 | Admitting: Family Medicine

## 2021-02-20 ENCOUNTER — Other Ambulatory Visit: Payer: Self-pay

## 2021-02-20 ENCOUNTER — Ambulatory Visit (INDEPENDENT_AMBULATORY_CARE_PROVIDER_SITE_OTHER): Payer: 59 | Admitting: Family Medicine

## 2021-02-20 ENCOUNTER — Encounter: Payer: Self-pay | Admitting: Family Medicine

## 2021-02-20 VITALS — BP 143/84 | HR 70 | Temp 98.1°F | Ht 69.0 in | Wt 229.4 lb

## 2021-02-20 DIAGNOSIS — E7800 Pure hypercholesterolemia, unspecified: Secondary | ICD-10-CM | POA: Diagnosis not present

## 2021-02-20 DIAGNOSIS — Z6833 Body mass index (BMI) 33.0-33.9, adult: Secondary | ICD-10-CM | POA: Insufficient documentation

## 2021-02-20 DIAGNOSIS — E876 Hypokalemia: Secondary | ICD-10-CM

## 2021-02-20 DIAGNOSIS — Z131 Encounter for screening for diabetes mellitus: Secondary | ICD-10-CM

## 2021-02-20 DIAGNOSIS — Z0000 Encounter for general adult medical examination without abnormal findings: Secondary | ICD-10-CM

## 2021-02-20 DIAGNOSIS — Z125 Encounter for screening for malignant neoplasm of prostate: Secondary | ICD-10-CM

## 2021-02-20 DIAGNOSIS — I10 Essential (primary) hypertension: Secondary | ICD-10-CM | POA: Diagnosis not present

## 2021-02-20 DIAGNOSIS — E782 Mixed hyperlipidemia: Secondary | ICD-10-CM

## 2021-02-20 MED ORDER — ZOSTER VAC RECOMB ADJUVANTED 50 MCG/0.5ML IM SUSR: 0.5000 mL | Freq: Once | INTRAMUSCULAR | 0 refills | Status: AC

## 2021-02-20 MED ORDER — CLONIDINE HCL 0.1 MG PO TABS: 0.1000 mg | ORAL_TABLET | Freq: Two times a day (BID) | ORAL | 3 refills | Status: DC

## 2021-02-20 MED ORDER — LOSARTAN POTASSIUM 100 MG PO TABS: 100.0000 mg | ORAL_TABLET | Freq: Every day | ORAL | 3 refills | Status: DC

## 2021-02-20 MED ORDER — AMLODIPINE BESYLATE 10 MG PO TABS: 10.0000 mg | ORAL_TABLET | Freq: Every day | ORAL | 3 refills | Status: DC

## 2021-02-20 MED ORDER — CHLORTHALIDONE 25 MG PO TABS
25.0000 mg | ORAL_TABLET | Freq: Every day | ORAL | 3 refills | Status: DC
Start: 2021-02-20 — End: 2021-07-16

## 2021-02-20 NOTE — Assessment & Plan Note (Signed)
Discussed addition of supplements; continue diet rich in potassium Discussed cause is most likely diuretic related

## 2021-02-20 NOTE — Assessment & Plan Note (Signed)
BMI 33.88 Discussed importance of healthy weight management Discussed diet and exercise

## 2021-02-20 NOTE — Assessment & Plan Note (Signed)
Denies concerns; PSA pending

## 2021-02-20 NOTE — Assessment & Plan Note (Signed)
Repeat lipid panel recommend diet low in saturated fat and regular exercise - 30 min at least 5 times per week

## 2021-02-20 NOTE — Assessment & Plan Note (Signed)
UTD on dentist and eye dr Things to do to keep yourself healthy  - Exercise at least 30-45 minutes a day, 3-4 days a week.  - Eat a low-fat diet with lots of fruits and vegetables, up to 7-9 servings per day.  - Seatbelts can save your life. Wear them always.  - Smoke detectors on every level of your home, check batteries every year.  - Eye Doctor - have an eye exam every 1-2 years  - Safe sex - if you may be exposed to STDs, use a condom.  - Alcohol -  If you drink, do it moderately, less than 2 drinks per day.  - [place]. Choose someone to speak for you if you are not able.  - Depression is common in our stressful world.If you're feeling down or losing interest in things you normally enjoy, please come in for a visit.  - Violence - If anyone is threatening or hurting you, please call immediately.

## 2021-02-20 NOTE — Assessment & Plan Note (Signed)
Recommend reduced fat diet

## 2021-02-20 NOTE — Progress Notes (Signed)
Complete physical exam   Patient: Randy Ingram   DOB: 07/07/1955   65 y.o. Male  MRN: [id number] Visit Date: 02/20/2021  Today's healthcare provider: Gwyneth Sprout, FNP   No chief complaint on file.  Subjective     HPI  Randy Ingram is a 65 y.o. male who presents today for a complete physical exam.  He reports consuming a general diet. Home exercise routine includes walking at work 5 days per week; no additional days of exercise. He generally feels fairly well. He reports sleeping fairly well. He does not have additional problems to discuss today.   Past Medical History:  Diagnosis Date   Hypertension    controlled on meds   Past Surgical History:  Procedure Laterality Date   COLONOSCOPY WITH PROPOFOL N/A 02/28/2015   Procedure: COLONOSCOPY WITH PROPOFOL;  Surgeon: Lucilla Lame, MD;  Location: [place];  Service: Endoscopy;  Laterality: N/A;   Social History   Socioeconomic History   Marital status: Married    Spouse name: Not on file   Number of children: Not on file   Years of education: Not on file   Highest education level: Not on file  Occupational History   Not on file  Tobacco Use   Smoking status: Never   Smokeless tobacco: Never  Substance and Sexual Activity   Alcohol use: No    Alcohol/week: 0.0 standard drinks   Drug use: No   Sexual activity: Not on file  Other Topics Concern   Not on file  Social History Narrative   Not on file   Social Determinants of Health   Financial Resource Strain: Not on file  Food Insecurity: Not on file  Transportation Needs: Not on file  Physical Activity: Not on file  Stress: Not on file  Social Connections: Not on file  Intimate Partner Violence: Not on file   Family Status  Relation Name Status   Father  Deceased   Sister 1 Alive   Brother 1 Alive   MGF  Deceased   Brother 2 Alive   Mother  Deceased   PGM  Deceased   Sister 2 Alive   Sister 3 Alive   Sister 66 Alive   Sister 55  Alive   Brother 3 Alive   Brother 4 Alive   MGM  Deceased   PGF  Deceased   Sister 75 Alive   Family History  Problem Relation Age of Onset   Cancer Father    Hypertension Sister    Stroke Brother    Heart disease Maternal Grandfather    Parkinson's disease Brother    Allergies  Allergen Reactions   Lidocaine     Other reaction(s): Dizzyness, Unconsciousness    Patient Care Team: Chrismon, Vickki Muff, PA-C (Inactive) as PCP - General (Physician Assistant)   Medications: Outpatient Medications Prior to Visit  Medication Sig   B COMPLEX VITAMINS PO 1 tablet daily. am   Krill Oil 1000 MG CAPS Take by mouth.   OMEGA-3 FATTY ACIDS PO Take 1,000 mg by mouth daily. am   [DISCONTINUED] amLODipine (NORVASC) 10 MG tablet TAKE 1 TABLET BY MOUTH ONCE DAILY   [DISCONTINUED] chlorthalidone (HYGROTON) 25 MG tablet TAKE 1 TABLET BY MOUTH ONCE DAILY   [DISCONTINUED] Cinnamon 500 MG capsule Take 500 mg by mouth daily.   [DISCONTINUED] cloNIDine (CATAPRES) 0.1 MG tablet Take 1 tablet (0.1 mg total) by mouth 2 (two) times daily.   [DISCONTINUED] losartan (COZAAR) 100  MG tablet TAKE 1 TABLET BY MOUTH ONCE DAILY (100 MG)   [DISCONTINUED] OVER THE COUNTER MEDICATION    No facility-administered medications prior to visit.    Review of Systems    Objective    BP (!) 143/84   Pulse 70   Temp 98.1 F (36.7 C) (Oral)   Ht 5\' 9"  (1.753 m)   Wt 229 lb 6.4 oz (104.1 kg)   SpO2 99%   BMI 33.88 kg/m    Physical Exam Vitals and nursing note reviewed.  Constitutional:      General: He is awake. He is not in acute distress.    Appearance: Normal appearance. He is well-developed and well-groomed. He is obese. He is not ill-appearing, toxic-appearing or diaphoretic.  HENT:     Head: Normocephalic and atraumatic.     Jaw: There is normal jaw occlusion. No trismus, tenderness, swelling or pain on movement.     Salivary Glands: Right salivary gland is not diffusely enlarged or tender. Left  salivary gland is not diffusely enlarged or tender.     Right Ear: Hearing, tympanic membrane, ear canal and external ear normal. There is no impacted cerumen.     Left Ear: Hearing, tympanic membrane, ear canal and external ear normal. There is no impacted cerumen.     Nose: Nose normal. No congestion or rhinorrhea.     Right Turbinates: Not enlarged, swollen or pale.     Left Turbinates: Not enlarged, swollen or pale.     Right Sinus: No maxillary sinus tenderness or frontal sinus tenderness.     Left Sinus: No maxillary sinus tenderness or frontal sinus tenderness.     Mouth/Throat:     Lips: Pink.     Mouth: Mucous membranes are moist. No injury, lacerations, oral lesions or angioedema.     Pharynx: Oropharynx is clear. Uvula midline. No pharyngeal swelling, oropharyngeal exudate or posterior oropharyngeal erythema.     Tonsils: No tonsillar exudate or tonsillar abscesses.  Eyes:     General: Lids are normal. Vision grossly intact. Gaze aligned appropriately.        Right eye: No discharge.        Left eye: No discharge.     Extraocular Movements: Extraocular movements intact.     Conjunctiva/sclera: Conjunctivae normal.     Pupils: Pupils are equal, round, and reactive to light.  Neck:     Thyroid: No thyroid mass, thyromegaly or thyroid tenderness.     Vascular: No carotid bruit.     Trachea: Trachea normal. No tracheal tenderness.  Cardiovascular:     Rate and Rhythm: Normal rate and regular rhythm.     Pulses: Normal pulses.          Carotid pulses are 2+ on the right side and 2+ on the left side.      Radial pulses are 2+ on the right side and 2+ on the left side.       Femoral pulses are 2+ on the right side and 2+ on the left side.      Popliteal pulses are 2+ on the right side and 2+ on the left side.       Dorsalis pedis pulses are 2+ on the right side and 2+ on the left side.       Posterior tibial pulses are 2+ on the right side and 2+ on the left side.     Heart  sounds: Normal heart sounds, S1 normal and S2 normal. No murmur heard.  No friction rub. No gallop.     Comments: Reported dizziness only when getting under the hood of a car to change the oil- denies claustrophobia  Pulmonary:     Effort: Pulmonary effort is normal. No respiratory distress.     Breath sounds: Normal breath sounds and air entry. No stridor. No wheezing, rhonchi or rales.  Chest:     Chest wall: No tenderness.  Abdominal:     General: Abdomen is flat. Bowel sounds are normal. There is no distension.     Palpations: Abdomen is soft. There is no mass.     Tenderness: There is no abdominal tenderness. There is no guarding or rebound.     Hernia: No hernia is present.  Genitourinary:    Comments: Exam deferred; denies complaints Musculoskeletal:        General: No swelling, tenderness, deformity or signs of injury. Normal range of motion.     Cervical back: Normal range of motion and neck supple. No rigidity or tenderness.     Right lower leg: No edema.     Left lower leg: No edema.  Lymphadenopathy:     Cervical: No cervical adenopathy.     Right cervical: No superficial, deep or posterior cervical adenopathy.    Left cervical: No superficial, deep or posterior cervical adenopathy.  Skin:    General: Skin is warm and dry.     Capillary Refill: Capillary refill takes less than 2 seconds.     Coloration: Skin is not jaundiced or pale.     Findings: No bruising, erythema, lesion or rash.  Neurological:     General: No focal deficit present.     Mental Status: He is alert and oriented to person, place, and time. Mental status is at baseline.     GCS: GCS eye subscore is 4. GCS verbal subscore is 5. GCS motor subscore is 6.     Sensory: Sensation is intact. No sensory deficit.     Motor: Motor function is intact. No weakness.     Coordination: Coordination is intact.     Gait: Gait is intact.  Psychiatric:        Attention and Perception: Attention and perception  normal.        Mood and Affect: Mood and affect normal.        Speech: Speech normal.        Behavior: Behavior normal. Behavior is cooperative.        Thought Content: Thought content normal.        Cognition and Memory: Cognition normal.        Judgment: Judgment normal.      Last depression screening scores PHQ 2/9 Scores 08/18/2020 01/01/2020 11/27/2018  PHQ - 2 Score 0 0 0  PHQ- 9 Score 0 0 -   Last fall risk screening Fall Risk  08/18/2020  Falls in the past year? 0  Number falls in past yr: 0  Injury with Fall? 0  Follow up -   Last Audit-C alcohol use screening Alcohol Use Disorder Test (AUDIT) 08/18/2020  1. How often do you have a drink containing alcohol? 0  2. How many drinks containing alcohol do you have on a typical day when you are drinking? 0  3. How often do you have six or more drinks on one occasion? 0  AUDIT-C Score 0  Alcohol Brief Interventions/Follow-up -   A score of 3 or more in women, and 4 or more in men indicates increased risk  for alcohol abuse, EXCEPT if all of the points are from question 1   No results found for any visits on 02/20/21.  Assessment & Plan    Routine Health Maintenance and Physical Exam  Exercise Activities and Dietary recommendations  Goals   None     Immunization History  Administered Date(s) Administered   Influenza Split 01/14/2014   Influenza-Unspecified 01/25/2017, 01/17/2018   Pneumococcal Polysaccharide-23 01/07/2012   Tdap 01/18/2011, 01/07/2012    Health Maintenance  Topic Date Due   COVID-19 Vaccine (1) Never done   Zoster Vaccines- Shingrix (1 of 2) Never done   INFLUENZA VACCINE  11/17/2020   TETANUS/TDAP  01/06/2022   COLONOSCOPY (Pts 45-79yrs Insurance coverage will need to be confirmed)  02/27/2025   Hepatitis C Screening  Completed   HIV Screening  Completed   Pneumococcal Vaccine 68-59 Years old  Aged Out   HPV VACCINES  Aged Out    Discussed health benefits of physical activity, and encouraged  him to engage in regular exercise appropriate for his age and condition.  Problem List Items Addressed This Visit       Cardiovascular and Mediastinum   Essential (primary) hypertension    Remains elevated on multiple agents Continue to suggest dietary and exercise involvement      Relevant Medications   amLODipine (NORVASC) 10 MG tablet   chlorthalidone (HYGROTON) 25 MG tablet   cloNIDine (CATAPRES) 0.1 MG tablet   losartan (COZAAR) 100 MG tablet     Other   Elevated LDL cholesterol level    Recommend reduced fat diet      Relevant Orders   Lipid panel   Hypokalemia    Discussed addition of supplements; continue diet rich in potassium Discussed cause is most likely diuretic related      Relevant Orders   Comprehensive metabolic panel   Annual physical exam - Primary    UTD on dentist and eye dr Things to do to keep yourself healthy  - Exercise at least 30-45 minutes a day, 3-4 days a week.  - Eat a low-fat diet with lots of fruits and vegetables, up to 7-9 servings per day.  - Seatbelts can save your life. Wear them always.  - Smoke detectors on every level of your home, check batteries every year.  - Eye Doctor - have an eye exam every 1-2 years  - Safe sex - if you may be exposed to STDs, use a condom.  - Alcohol -  If you drink, do it moderately, less than 2 drinks per day.  - [place]. Choose someone to speak for you if you are not able.  - Depression is common in our stressful world.If you're feeling down or losing interest in things you normally enjoy, please come in for a visit.  - Violence - If anyone is threatening or hurting you, please call immediately.        Elevated cholesterol with high triglycerides    Repeat lipid panel recommend diet low in saturated fat and regular exercise - 30 min at least 5 times per week       Relevant Medications   amLODipine (NORVASC) 10 MG tablet   chlorthalidone (HYGROTON) 25 MG tablet    cloNIDine (CATAPRES) 0.1 MG tablet   losartan (COZAAR) 100 MG tablet   Prostate cancer screening    Denies concerns; PSA pending      Relevant Orders   PSA   BMI 33.0-33.9,adult    BMI 33.88  Discussed importance of healthy weight management Discussed diet and exercise       Diabetes mellitus screening    Check a1c given obesity, htn and hld      Relevant Orders   Hemoglobin A1c    Return in about 1 year (around 02/20/2022) for annual examination.     Vonna Kotyk, FNP, have reviewed all documentation for this visit. The documentation on 02/20/21 for the exam, diagnosis, procedures, and orders are all accurate and complete.    Gwyneth Sprout, [place] [phone] (phone) [phone] (fax)  [place]

## 2021-02-20 NOTE — Assessment & Plan Note (Signed)
Check a1c given obesity, htn and hld

## 2021-02-20 NOTE — Assessment & Plan Note (Signed)
Remains elevated on multiple agents Continue to suggest dietary and exercise involvement

## 2021-02-21 LAB — COMPREHENSIVE METABOLIC PANEL
ALT: 26 IU/L (ref 0–44)
AST: 23 IU/L (ref 0–40)
Albumin/Globulin Ratio: 2 (ref 1.2–2.2)
Albumin: 4.6 g/dL (ref 3.8–4.8)
Alkaline Phosphatase: 87 IU/L (ref 44–121)
BUN/Creatinine Ratio: 18 (ref 10–24)
BUN: 19 mg/dL (ref 8–27)
Bilirubin Total: 0.4 mg/dL (ref 0.0–1.2)
CO2: 28 mmol/L (ref 20–29)
Calcium: 9.5 mg/dL (ref 8.6–10.2)
Chloride: 102 mmol/L (ref 96–106)
Creatinine, Ser: 1.04 mg/dL (ref 0.76–1.27)
Globulin, Total: 2.3 g/dL (ref 1.5–4.5)
Glucose: 92 mg/dL (ref 70–99)
Potassium: 3.4 mmol/L — ABNORMAL LOW (ref 3.5–5.2)
Sodium: 144 mmol/L (ref 134–144)
Total Protein: 6.9 g/dL (ref 6.0–8.5)
eGFR: 81 mL/min/{1.73_m2} (ref >59)

## 2021-02-21 LAB — LIPID PANEL
Chol/HDL Ratio: 4 ratio (ref 0.0–5.0)
Cholesterol, Total: 185 mg/dL (ref 100–199)
HDL: 46 mg/dL (ref >39)
LDL Chol Calc (NIH): 112 mg/dL — ABNORMAL HIGH (ref 0–99)
Triglycerides: 154 mg/dL — ABNORMAL HIGH (ref 0–149)
VLDL Cholesterol Cal: 27 mg/dL (ref 5–40)

## 2021-02-21 LAB — HEMOGLOBIN A1C
Est. average glucose Bld gHb Est-mCnc: 111 mg/dL
Hgb A1c MFr Bld: 5.5 % (ref 4.8–5.6)

## 2021-02-21 LAB — PSA: Prostate Specific Ag, Serum: 1.6 ng/mL (ref 0.0–4.0)

## 2021-02-23 ENCOUNTER — Other Ambulatory Visit: Payer: Self-pay | Admitting: Family Medicine

## 2021-02-23 DIAGNOSIS — E876 Hypokalemia: Secondary | ICD-10-CM

## 2021-02-23 MED ORDER — POTASSIUM CHLORIDE ER 10 MEQ PO TBCR: 20.0000 meq | EXTENDED_RELEASE_TABLET | Freq: Every day | ORAL | 3 refills | Status: DC

## 2021-02-23 MED ORDER — ROSUVASTATIN CALCIUM 5 MG PO TABS: 5.0000 mg | ORAL_TABLET | Freq: Every day | ORAL | 3 refills | Status: DC

## 2021-07-16 ENCOUNTER — Other Ambulatory Visit: Payer: Self-pay

## 2021-07-16 ENCOUNTER — Telehealth: Payer: Self-pay | Admitting: Family Medicine

## 2021-07-16 DIAGNOSIS — I10 Essential (primary) hypertension: Secondary | ICD-10-CM

## 2021-07-16 MED ORDER — CHLORTHALIDONE 25 MG PO TABS: 25.0000 mg | ORAL_TABLET | Freq: Every day | ORAL | 3 refills | Status: DC

## 2021-07-16 MED ORDER — AMLODIPINE BESYLATE 10 MG PO TABS: 10.0000 mg | ORAL_TABLET | Freq: Every day | ORAL | 3 refills | Status: AC

## 2021-07-16 NOTE — Telephone Encounter (Signed)
[facility] faxed refill request for the following medications: ? ?chlorthalidone (HYGROTON) 25 MG tablet  ? ?amLODipine (NORVASC) 10 MG tablet  ? ?Please advise. ? ?

## 2021-12-16 ENCOUNTER — Telehealth: Payer: Self-pay | Admitting: Family Medicine

## 2021-12-16 ENCOUNTER — Other Ambulatory Visit: Payer: Self-pay

## 2021-12-16 DIAGNOSIS — I10 Essential (primary) hypertension: Secondary | ICD-10-CM

## 2021-12-16 MED ORDER — LOSARTAN POTASSIUM 100 MG PO TABS: 100.0000 mg | ORAL_TABLET | Freq: Every day | ORAL | 0 refills | Status: AC

## 2021-12-16 NOTE — Telephone Encounter (Signed)
[facility] faxed refill request for the following medications:  losartan (COZAAR) 100 MG tablet   Please advise.  

## 2022-02-09 ENCOUNTER — Other Ambulatory Visit: Payer: Self-pay | Admitting: Family Medicine

## 2022-02-09 DIAGNOSIS — I10 Essential (primary) hypertension: Secondary | ICD-10-CM

## 2023-09-01 ENCOUNTER — Other Ambulatory Visit: Payer: Self-pay

## 2023-09-01 ENCOUNTER — Encounter
Admission: RE | Disposition: A | Discharge status: Home / Self Care | Payer: Self-pay | Source: Home / Self Care | Attending: Internal Medicine

## 2023-09-01 ENCOUNTER — Ambulatory Visit
Admission: RE | Admit: 2023-09-01 | Discharge: 2023-09-01 | Disposition: A | Discharge status: Home / Self Care | Attending: Internal Medicine | Admitting: Internal Medicine

## 2023-09-01 ENCOUNTER — Encounter: Payer: Self-pay | Admitting: Internal Medicine

## 2023-09-01 DIAGNOSIS — R9439 Abnormal result of other cardiovascular function study: Secondary | ICD-10-CM | POA: Diagnosis present

## 2023-09-01 DIAGNOSIS — I10 Essential (primary) hypertension: Secondary | ICD-10-CM | POA: Diagnosis not present

## 2023-09-01 DIAGNOSIS — Z7982 Long term (current) use of aspirin: Secondary | ICD-10-CM | POA: Diagnosis not present

## 2023-09-01 DIAGNOSIS — I25119 Atherosclerotic heart disease of native coronary artery with unspecified angina pectoris: Secondary | ICD-10-CM | POA: Insufficient documentation

## 2023-09-01 DIAGNOSIS — I2582 Chronic total occlusion of coronary artery: Secondary | ICD-10-CM | POA: Insufficient documentation

## 2023-09-01 DIAGNOSIS — Z79899 Other long term (current) drug therapy: Secondary | ICD-10-CM | POA: Insufficient documentation

## 2023-09-01 HISTORY — PX: LEFT HEART CATH AND CORONARY ANGIOGRAPHY: CATH118249

## 2023-09-01 SURGERY — LEFT HEART CATH AND CORONARY ANGIOGRAPHY
Anesthesia: Moderate Sedation | Laterality: Left

## 2023-09-01 MED ORDER — SODIUM CHLORIDE 0.9 % WEIGHT BASED INFUSION
3.0000 mL/kg/h | INTRAVENOUS | Status: AC
  Administered 2023-09-01: 3 mL/kg/h via INTRAVENOUS

## 2023-09-01 MED ORDER — SODIUM CHLORIDE 0.9 % WEIGHT BASED INFUSION: 1.0000 mL/kg/h | INTRAVENOUS | Status: DC

## 2023-09-01 MED ORDER — ISOSORBIDE MONONITRATE ER 30 MG PO TB24: 30.0000 mg | ORAL_TABLET | Freq: Every day | ORAL | 0 refills | Status: AC

## 2023-09-01 MED ORDER — HEPARIN SODIUM (PORCINE) 1000 UNIT/ML IJ SOLN
INTRAMUSCULAR | Status: AC
Start: 2023-09-01 — End: ?
  Filled 2023-09-01: qty 10

## 2023-09-01 MED ORDER — ASPIRIN 81 MG PO CHEW
81.0000 mg | CHEWABLE_TABLET | ORAL | Status: DC
Start: 2023-09-02 — End: 2023-09-01

## 2023-09-01 MED ORDER — FENTANYL CITRATE (PF) 100 MCG/2ML IJ SOLN
INTRAMUSCULAR | Status: DC | PRN
  Administered 2023-09-01: 50 ug via INTRAVENOUS

## 2023-09-01 MED ORDER — LIDOCAINE HCL 1 % IJ SOLN
INTRAMUSCULAR | Status: AC
  Filled 2023-09-01: qty 20

## 2023-09-01 MED ORDER — HEPARIN (PORCINE) IN NACL 1000-0.9 UT/500ML-% IV SOLN
INTRAVENOUS | Status: DC | PRN
  Administered 2023-09-01: 1000 mL

## 2023-09-01 MED ORDER — SODIUM CHLORIDE 0.9% FLUSH: 3.0000 mL | INTRAVENOUS | Status: DC | PRN

## 2023-09-01 MED ORDER — VERAPAMIL HCL 2.5 MG/ML IV SOLN
INTRAVENOUS | Status: DC | PRN
  Administered 2023-09-01: 2.5 mg via INTRA_ARTERIAL

## 2023-09-01 MED ORDER — IOHEXOL 300 MG/ML  SOLN
INTRAMUSCULAR | Status: DC | PRN
  Administered 2023-09-01: 78 mL

## 2023-09-01 MED ORDER — VERAPAMIL HCL 2.5 MG/ML IV SOLN
INTRAVENOUS | Status: AC
  Filled 2023-09-01: qty 2

## 2023-09-01 MED ORDER — HEPARIN SODIUM (PORCINE) 1000 UNIT/ML IJ SOLN
INTRAMUSCULAR | Status: DC | PRN
  Administered 2023-09-01: 4000 [IU] via INTRAVENOUS

## 2023-09-01 MED ORDER — BUPIVACAINE HCL (PF) 0.5 % IJ SOLN
INTRAMUSCULAR | Status: AC
  Filled 2023-09-01: qty 30

## 2023-09-01 MED ORDER — FENTANYL CITRATE (PF) 100 MCG/2ML IJ SOLN
INTRAMUSCULAR | Status: AC
  Filled 2023-09-01: qty 2

## 2023-09-01 MED ORDER — SODIUM CHLORIDE 0.9% FLUSH: 3.0000 mL | Freq: Two times a day (BID) | INTRAVENOUS | Status: DC

## 2023-09-01 MED ORDER — HEPARIN (PORCINE) IN NACL 1000-0.9 UT/500ML-% IV SOLN
INTRAVENOUS | Status: AC
  Filled 2023-09-01: qty 1000

## 2023-09-01 MED ORDER — MIDAZOLAM HCL 2 MG/2ML IJ SOLN
INTRAMUSCULAR | Status: AC
  Filled 2023-09-01: qty 2

## 2023-09-01 MED ORDER — SODIUM CHLORIDE 0.9 % IV SOLN: 250.0000 mL | INTRAVENOUS | Status: DC | PRN

## 2023-09-01 MED ORDER — BUPIVACAINE HCL (PF) 0.5 % IJ SOLN
INTRAMUSCULAR | Status: DC | PRN
  Administered 2023-09-01: 2 mL

## 2023-09-01 MED ORDER — MIDAZOLAM HCL 2 MG/2ML IJ SOLN
INTRAMUSCULAR | Status: DC | PRN
  Administered 2023-09-01: 1 mg via INTRAVENOUS

## 2023-09-01 SURGICAL SUPPLY — 9 items
CATH INFINITI 5 FR JL3.5 (CATHETERS) IMPLANT
CATH INFINITI JR4 5F (CATHETERS) IMPLANT
DEVICE RAD TR BAND REGULAR (VASCULAR PRODUCTS) IMPLANT
DRAPE BRACHIAL (DRAPES) IMPLANT
GLIDESHEATH SLEND SS 6F .021 (SHEATH) IMPLANT
GUIDEWIRE INQWIRE 1.5J.035X260 (WIRE) IMPLANT
PACK CARDIAC CATH (CUSTOM PROCEDURE TRAY) ×1 IMPLANT
SET ATX-X65L (MISCELLANEOUS) IMPLANT
STATION PROTECTION PRESSURIZED (MISCELLANEOUS) IMPLANT

## 2023-09-01 NOTE — Discharge Instructions (Signed)
 Radial Site Care Refer to this sheet in the next few weeks. These instructions provide you with information about caring for yourself after your procedure. Your health care provider may also give you more specific instructions. Your treatment has been planned according to current medical practices, but problems sometimes occur. Call your health care provider if you have any problems or questions after your procedure. What can I expect after the procedure? After your procedure, it is typical to have the following: Bruising at the radial site that usually fades within 1-2 weeks. Blood collecting in the tissue (hematoma) that may be painful to the touch. It should usually decrease in size and tenderness within 1-2 weeks.  Follow these instructions at home: Take medicines only as directed by your health care provider. If you are on a medication called Metformin please do not take for 48 hours after your procedure. Over the next 48hrs please increase your fluid intake of water and non caffeine beverages to flush the contrast dye out of your system.  You may shower 24 hours after the procedure  Leave your bandage on and gently wash the site with plain soap and water. Pat the area dry with a clean towel. Do not rub the site, because this may cause bleeding.  Remove your dressing 48hrs after your procedure and leave open to air.  Do not submerge your site in water for 7 days. This includes swimming and washing dishes.  Check your insertion site every day for redness, swelling, or drainage. Do not apply powder or lotion to the site. Do not flex or bend the affected arm for 24 hours or as directed by your health care provider. Do not push or pull heavy objects with the affected arm for 24 hours or as directed by your health care provider. Do not lift over 10 lb (4.5 kg) for 5 days after your procedure or as directed by your health care provider. Ask your health care provider when it is okay to: Return to  work or school. Resume usual physical activities or sports. Resume sexual activity. Do not drive home if you are discharged the same day as the procedure. Have someone else drive you. You may drive 48 hours after the procedure Do not operate machinery or power tools for 24 hours after the procedure. If your procedure was done as an outpatient procedure, which means that you went home the same day as your procedure, a responsible adult should be with you for the first 24 hours after you arrive home. Keep all follow-up visits as directed by your health care provider. This is important. Contact a health care provider if: You have a fever. You have chills. You have increased bleeding from the radial site. Hold pressure on the site. Get help right away if: You have unusual pain at the radial site. You have redness, warmth, or swelling at the radial site. You have drainage (other than a small amount of blood on the dressing) from the radial site. The radial site is bleeding, and the bleeding does not stop after 15 minutes of holding steady pressure on the site. Your arm or hand becomes pale, cool, tingly, or numb. This information is not intended to replace advice given to you by your health care provider. Make sure you discuss any questions you have with your health care provider. Document Released: 05/08/2010 Document Revised: 09/11/2015 Document Reviewed: 10/22/2013 Elsevier Interactive Patient Education  2018 [employer].

## 2023-09-01 NOTE — CV Procedure (Signed)
 Brief post cath outpatient note Indication is angina positive imaging  Right radial approach  Left ventriculogram normal left ventricular function EF of at least 55%  Coronaries Left main large free of disease LAD large 75% mid long lesion Diagonal 2 small diffusely disease 80% Circumflex is large OM1 with 75% proximal lesion OM 2 large subtotal TIMI I flow RCA very large 75% proximal lesion Right dominant system Faint collaterals right to the left  Intervention deferred Will have a heart team approach and evaluate his coronaries and have a discussion about treatment options Options include coronary bypass surgery/multivessel intervention as well as GDMT medical therapy for antianginals We will have the images reviewed by heart team and other interventionalists at tertiary care center  Patient tolerated procedure well No complications Full cath note to follow  Burney Carter MD 09/01/2023

## 2023-09-04 LAB — CARDIAC CATHETERIZATION: Cath EF Quantitative: 55 %

## 2023-09-05 ENCOUNTER — Encounter: Payer: Self-pay | Admitting: Internal Medicine

## 2023-10-05 ENCOUNTER — Other Ambulatory Visit: Payer: Self-pay

## 2023-10-05 ENCOUNTER — Encounter: Admission: RE | Disposition: A | Discharge status: Home / Self Care | Payer: Self-pay | Source: Ambulatory Visit

## 2023-10-05 ENCOUNTER — Ambulatory Visit
Admission: RE | Admit: 2023-10-05 | Discharge: 2023-10-05 | Disposition: A | Discharge status: Home / Self Care | Source: Ambulatory Visit

## 2023-10-05 DIAGNOSIS — I10 Essential (primary) hypertension: Secondary | ICD-10-CM | POA: Diagnosis not present

## 2023-10-05 DIAGNOSIS — I25118 Atherosclerotic heart disease of native coronary artery with other forms of angina pectoris: Secondary | ICD-10-CM | POA: Insufficient documentation

## 2023-10-05 DIAGNOSIS — Z79899 Other long term (current) drug therapy: Secondary | ICD-10-CM | POA: Diagnosis not present

## 2023-10-05 DIAGNOSIS — I25708 Atherosclerosis of coronary artery bypass graft(s), unspecified, with other forms of angina pectoris: Secondary | ICD-10-CM | POA: Insufficient documentation

## 2023-10-05 DIAGNOSIS — R9439 Abnormal result of other cardiovascular function study: Secondary | ICD-10-CM | POA: Insufficient documentation

## 2023-10-05 HISTORY — PX: CORONARY STENT INTERVENTION: CATH118234

## 2023-10-05 HISTORY — PX: CORONARY ULTRASOUND/IVUS: CATH118244

## 2023-10-05 HISTORY — PX: LEFT HEART CATH AND CORONARY ANGIOGRAPHY: CATH118249

## 2023-10-05 LAB — BASIC METABOLIC PANEL WITH GFR
Anion gap: 6 (ref 5–15)
BUN: 20 mg/dL (ref 8–23)
CO2: 35 mmol/L — ABNORMAL HIGH (ref 22–32)
Calcium: 9.5 mg/dL (ref 8.9–10.3)
Chloride: 99 mmol/L (ref 98–111)
Creatinine, Ser: 1.07 mg/dL (ref 0.61–1.24)
GFR, Estimated: >60 mL/min (ref >60)
Glucose, Bld: 111 mg/dL — ABNORMAL HIGH (ref 70–99)
Potassium: 3 mmol/L — ABNORMAL LOW (ref 3.5–5.1)
Sodium: 140 mmol/L (ref 135–145)

## 2023-10-05 LAB — CBC
HCT: 49.8 % (ref 39.0–52.0)
Hemoglobin: 17 g/dL (ref 13.0–17.0)
MCH: 30.2 pg (ref 26.0–34.0)
MCHC: 34.1 g/dL (ref 30.0–36.0)
MCV: 88.6 fL (ref 80.0–100.0)
Platelets: 276 10*3/uL (ref 150–400)
RBC: 5.62 MIL/uL (ref 4.22–5.81)
RDW: 13 % (ref 11.5–15.5)
WBC: 7.9 10*3/uL (ref 4.0–10.5)
nRBC: 0 % (ref 0.0–0.2)

## 2023-10-05 LAB — POCT ACTIVATED CLOTTING TIME
Activated Clotting Time: 250 s
Activated Clotting Time: 268 s

## 2023-10-05 SURGERY — LEFT HEART CATH AND CORONARY ANGIOGRAPHY
Anesthesia: Moderate Sedation

## 2023-10-05 MED ORDER — HEPARIN SODIUM (PORCINE) 1000 UNIT/ML IJ SOLN
INTRAMUSCULAR | Status: AC
Start: 2023-10-05 — End: 2023-10-05
  Filled 2023-10-05: qty 10

## 2023-10-05 MED ORDER — FENTANYL CITRATE (PF) 100 MCG/2ML IJ SOLN
INTRAMUSCULAR | Status: DC | PRN
  Administered 2023-10-05: 50 ug via INTRAVENOUS

## 2023-10-05 MED ORDER — VERAPAMIL HCL 2.5 MG/ML IV SOLN
INTRAVENOUS | Status: AC
  Filled 2023-10-05: qty 2

## 2023-10-05 MED ORDER — MIDAZOLAM HCL 2 MG/2ML IJ SOLN
INTRAMUSCULAR | Status: AC
  Filled 2023-10-05: qty 2

## 2023-10-05 MED ORDER — ONDANSETRON HCL 4 MG/2ML IJ SOLN: 4.0000 mg | Freq: Four times a day (QID) | INTRAMUSCULAR | Status: DC | PRN

## 2023-10-05 MED ORDER — SODIUM CHLORIDE 0.9% FLUSH: 3.0000 mL | INTRAVENOUS | Status: DC | PRN

## 2023-10-05 MED ORDER — HEPARIN SODIUM (PORCINE) 1000 UNIT/ML IJ SOLN
INTRAMUSCULAR | Status: AC
  Filled 2023-10-05: qty 10

## 2023-10-05 MED ORDER — CLOPIDOGREL BISULFATE 75 MG PO TABS
75.0000 mg | ORAL_TABLET | Freq: Every day | ORAL | 0 refills | Status: AC
  Filled 2023-10-05: qty 60, 60d supply, fill #0

## 2023-10-05 MED ORDER — ASPIRIN 81 MG PO CHEW: 81.0000 mg | CHEWABLE_TABLET | Freq: Every day | ORAL | Status: DC

## 2023-10-05 MED ORDER — SODIUM CHLORIDE 0.9 % WEIGHT BASED INFUSION
1.0000 mL/kg/h | INTRAVENOUS | Status: DC
  Administered 2023-10-05: 1 mL/kg/h via INTRAVENOUS

## 2023-10-05 MED ORDER — SODIUM CHLORIDE 0.9 % IV SOLN: 250.0000 mL | INTRAVENOUS | Status: DC | PRN

## 2023-10-05 MED ORDER — SODIUM CHLORIDE 0.9 % WEIGHT BASED INFUSION
300.0000 mL/h | INTRAVENOUS | Status: DC
  Administered 2023-10-05: 300 mL/h via INTRAVENOUS

## 2023-10-05 MED ORDER — NITROGLYCERIN 1 MG/10 ML FOR IR/CATH LAB
INTRA_ARTERIAL | Status: DC | PRN
  Administered 2023-10-05 (×2): 200 ug via INTRA_ARTERIAL

## 2023-10-05 MED ORDER — CLOPIDOGREL BISULFATE 75 MG PO TABS: 75.0000 mg | ORAL_TABLET | Freq: Every day | ORAL | Status: DC

## 2023-10-05 MED ORDER — ACETAMINOPHEN 325 MG PO TABS: 650.0000 mg | ORAL_TABLET | ORAL | Status: DC | PRN

## 2023-10-05 MED ORDER — HEPARIN (PORCINE) IN NACL 1000-0.9 UT/500ML-% IV SOLN
INTRAVENOUS | Status: AC
  Filled 2023-10-05: qty 1000

## 2023-10-05 MED ORDER — HYDRALAZINE HCL 20 MG/ML IJ SOLN: 10.0000 mg | INTRAMUSCULAR | Status: AC | PRN

## 2023-10-05 MED ORDER — HEPARIN SODIUM (PORCINE) 1000 UNIT/ML IJ SOLN
INTRAMUSCULAR | Status: DC | PRN
  Administered 2023-10-05 (×2): 4000 [IU] via INTRAVENOUS
  Administered 2023-10-05: 10000 [IU] via INTRAVENOUS

## 2023-10-05 MED ORDER — BUPIVACAINE HCL (PF) 0.5 % IJ SOLN
INTRAMUSCULAR | Status: AC
  Filled 2023-10-05: qty 30

## 2023-10-05 MED ORDER — IOHEXOL 300 MG/ML  SOLN
INTRAMUSCULAR | Status: DC | PRN
  Administered 2023-10-05: 62 mL

## 2023-10-05 MED ORDER — HEPARIN (PORCINE) IN NACL 1000-0.9 UT/500ML-% IV SOLN
INTRAVENOUS | Status: DC | PRN
  Administered 2023-10-05: 1000 mL

## 2023-10-05 MED ORDER — MIDAZOLAM HCL 2 MG/2ML IJ SOLN
INTRAMUSCULAR | Status: DC | PRN
  Administered 2023-10-05: 1 mg via INTRAVENOUS

## 2023-10-05 MED ORDER — CLOPIDOGREL BISULFATE 75 MG PO TABS
ORAL_TABLET | ORAL | Status: AC
Start: 2023-10-05 — End: 2023-10-05
  Filled 2023-10-05: qty 8

## 2023-10-05 MED ORDER — SODIUM CHLORIDE 0.9% FLUSH: 3.0000 mL | Freq: Two times a day (BID) | INTRAVENOUS | Status: DC

## 2023-10-05 MED ORDER — CLOPIDOGREL BISULFATE 75 MG PO TABS: 75.0000 mg | ORAL_TABLET | Freq: Every day | ORAL | 0 refills | Status: AC

## 2023-10-05 MED ORDER — CLOPIDOGREL BISULFATE 75 MG PO TABS
ORAL_TABLET | ORAL | Status: DC | PRN
  Administered 2023-10-05: 600 mg via ORAL

## 2023-10-05 MED ORDER — NITROGLYCERIN 1 MG/10 ML FOR IR/CATH LAB
INTRA_ARTERIAL | Status: AC
Start: 2023-10-05 — End: 2023-10-05
  Filled 2023-10-05: qty 10

## 2023-10-05 MED ORDER — FENTANYL CITRATE (PF) 100 MCG/2ML IJ SOLN
INTRAMUSCULAR | Status: AC
  Filled 2023-10-05: qty 2

## 2023-10-05 MED ORDER — SODIUM CHLORIDE 0.9 % WEIGHT BASED INFUSION: 100.0000 mL/h | INTRAVENOUS | Status: DC

## 2023-10-05 MED ORDER — BUPIVACAINE HCL (PF) 0.5 % IJ SOLN
INTRAMUSCULAR | Status: DC | PRN
  Administered 2023-10-05: 2 mL

## 2023-10-05 MED ORDER — ASPIRIN 81 MG PO CHEW
81.0000 mg | CHEWABLE_TABLET | ORAL | Status: DC
Start: 2023-10-06 — End: 2023-10-05

## 2023-10-05 SURGICAL SUPPLY — 17 items
BALLOON ~~LOC~~ TREK NEO RX 2.5X20 (BALLOONS) IMPLANT
BALLOON ~~LOC~~ TREK NEO RX 3.5X20 (BALLOONS) IMPLANT
CATH EAGLE EYE PLAT IMAGING (CATHETERS) IMPLANT
CATH LAUNCHER 6FR JL3.5 (CATHETERS) IMPLANT
DEVICE RAD TR BAND REGULAR (VASCULAR PRODUCTS) IMPLANT
DRAPE BRACHIAL (DRAPES) IMPLANT
GLIDESHEATH SLEND A-KIT 6F 22G (SHEATH) IMPLANT
GUIDEWIRE INQWIRE 1.5J.035X260 (WIRE) IMPLANT
KIT ENCORE 26 ADVANTAGE (KITS) IMPLANT
KIT SYRINGE INJ CVI SPIKEX1 (MISCELLANEOUS) IMPLANT
PACK CARDIAC CATH (CUSTOM PROCEDURE TRAY) ×2 IMPLANT
PAD ELECT DEFIB RADIOL ZOLL (MISCELLANEOUS) IMPLANT
SET ATX-X65L (MISCELLANEOUS) IMPLANT
STATION PROTECTION PRESSURIZED (MISCELLANEOUS) IMPLANT
STENT ONYX FRONTIER 2.75X26 (Permanent Stent) IMPLANT
STENT ONYX FRONTIER 3.0X26 (Permanent Stent) IMPLANT
WIRE G HI TQ BMW 190 (WIRE) IMPLANT

## 2023-11-11 ENCOUNTER — Encounter: Attending: Internal Medicine | Admitting: *Deleted

## 2023-11-11 DIAGNOSIS — Z48812 Encounter for surgical aftercare following surgery on the circulatory system: Secondary | ICD-10-CM | POA: Insufficient documentation

## 2023-11-11 DIAGNOSIS — Z955 Presence of coronary angioplasty implant and graft: Secondary | ICD-10-CM | POA: Insufficient documentation

## 2023-11-11 NOTE — Progress Notes (Signed)
 Initial phone call completed. Diagnosis can be found in [facility] 6/23. EP Orientation scheduled for Thursday 7/31 at 8am.

## 2023-11-17 ENCOUNTER — Encounter

## 2023-11-17 VITALS — Ht 69.5 in | Wt 212.1 lb

## 2023-11-17 DIAGNOSIS — Z48812 Encounter for surgical aftercare following surgery on the circulatory system: Secondary | ICD-10-CM | POA: Diagnosis not present

## 2023-11-17 DIAGNOSIS — Z955 Presence of coronary angioplasty implant and graft: Secondary | ICD-10-CM

## 2023-11-17 NOTE — Patient Instructions (Addendum)
 Patient Instructions  Patient Details  Name: Randy Ingram MRN: [id number] Date of Birth: 1955/06/30 Referring Provider:  Katina Albright, MD  Below are your personal goals for exercise, nutrition, and risk factors. Our goal is to help you stay on track towards obtaining and maintaining these goals. We will be discussing your progress on these goals with you throughout the program.  Initial Exercise Prescription:  Initial Exercise Prescription - 11/17/23 0900       Date of Initial Exercise RX and Referring Provider   Date 11/17/23    Referring Provider Dr. Albright Katina, MD      Oxygen   Maintain Oxygen Saturation 88% or higher      Treadmill   MPH 2.8    Grade 1    Minutes 15    METs 3.53      REL-XR   Level 3    Speed 50    Minutes 15    METs 3.37      Rower   Level 4    Watts 35    Minutes 15    METs 3.37      Prescription Details   Frequency (times per week) 3    Duration Progress to 30 minutes of continuous aerobic without signs/symptoms of physical distress      Intensity   THRR 40-80% of Max Heartrate 97-135    Ratings of Perceived Exertion 11-13    Perceived Dyspnea 0-4      Progression   Progression Continue to progress workloads to maintain intensity without signs/symptoms of physical distress.      Resistance Training   Training Prescription Yes    Weight 7 lb    Reps 10-15          Exercise Goals: Frequency: Be able to perform aerobic exercise two to three times per week in program working toward 2-5 days per week of home exercise.  Intensity: Work with a perceived exertion of 11 (fairly light) - 15 (hard) while following your exercise prescription.  We will make changes to your prescription with you as you progress through the program.   Duration: Be able to do 30 to 45 minutes of continuous aerobic exercise in addition to a 5 minute warm-up and a 5 minute cool-down routine.   Nutrition Goals: Your personal nutrition goals will be established  when you do your nutrition analysis with the dietician.  The following are general nutrition guidelines to follow: Cholesterol < 200mg /day Sodium < 1500mg /day Fiber: Men over 50 yrs - 30 grams per day  Personal Goals:  Personal Goals and Risk Factors at Admission - 11/11/23 1405       Core Components/Risk Factors/Patient Goals on Admission    Weight Management Yes;Weight Loss    Intervention Weight Management: Develop a combined nutrition and exercise program designed to reach desired caloric intake, while maintaining appropriate intake of nutrient and fiber, sodium and fats, and appropriate energy expenditure required for the weight goal.;Weight Management: Provide education and appropriate resources to help participant work on and attain dietary goals.;Weight Management/Obesity: Establish reasonable short term and long term weight goals.    Expected Outcomes Short Term: Continue to assess and modify interventions until short term weight is achieved;Long Term: Adherence to nutrition and physical activity/exercise program aimed toward attainment of established weight goal;Weight Loss: Understanding of general recommendations for a balanced deficit meal plan, which promotes 1-2 lb weight loss per week and includes a negative energy balance of [phone] kcal/d;Understanding recommendations for meals to include  15-35% energy as protein, 25-35% energy from fat, 35-60% energy from carbohydrates, less than 200mg  of dietary cholesterol, 20-35 gm of total fiber daily;Understanding of distribution of calorie intake throughout the day with the consumption of 4-5 meals/snacks    Hypertension Yes    Intervention Provide education on lifestyle modifcations including regular physical activity/exercise, weight management, moderate sodium restriction and increased consumption of fresh fruit, vegetables, and low fat dairy, alcohol moderation, and smoking cessation.;Monitor prescription use compliance.    Expected  Outcomes Short Term: Continued assessment and intervention until BP is < 140/73mm HG in hypertensive participants. < 130/27mm HG in hypertensive participants with diabetes, heart failure or chronic kidney disease.;Long Term: Maintenance of blood pressure at goal levels.    Lipids Yes    Intervention Provide education and support for participant on nutrition & aerobic/resistive exercise along with prescribed medications to achieve LDL 70mg , HDL >40mg .    Expected Outcomes Short Term: Participant states understanding of desired cholesterol values and is compliant with medications prescribed. Participant is following exercise prescription and nutrition guidelines.;Long Term: Cholesterol controlled with medications as prescribed, with individualized exercise RX and with personalized nutrition plan. Value goals: LDL < 70mg , HDL > 40 mg.          Exercise Goals and Review:  Exercise Goals     Row Name 11/17/23 0917             Exercise Goals   Increase Physical Activity Yes       Intervention Provide advice, education, support and counseling about physical activity/exercise needs.;Develop an individualized exercise prescription for aerobic and resistive training based on initial evaluation findings, risk stratification, comorbidities and participant's personal goals.       Expected Outcomes Short Term: Attend rehab on a regular basis to increase amount of physical activity.;Long Term: Add in home exercise to make exercise part of routine and to increase amount of physical activity.;Long Term: Exercising regularly at least 3-5 days a week.       Increase Strength and Stamina Yes       Intervention Provide advice, education, support and counseling about physical activity/exercise needs.;Develop an individualized exercise prescription for aerobic and resistive training based on initial evaluation findings, risk stratification, comorbidities and participant's personal goals.       Expected Outcomes  Long Term: Improve cardiorespiratory fitness, muscular endurance and strength as measured by increased METs and functional capacity ( );Short Term: Increase workloads from initial exercise prescription for resistance, speed, and METs.;Short Term: Perform resistance training exercises routinely during rehab and add in resistance training at home       Able to understand and use rate of perceived exertion (RPE) scale Yes       Intervention Provide education and explanation on how to use RPE scale       Expected Outcomes Short Term: Able to use RPE daily in rehab to express subjective intensity level;Long Term:  Able to use RPE to guide intensity level when exercising independently       Able to understand and use Dyspnea scale Yes       Intervention Provide education and explanation on how to use Dyspnea scale       Expected Outcomes Short Term: Able to use Dyspnea scale daily in rehab to express subjective sense of shortness of breath during exertion;Long Term: Able to use Dyspnea scale to guide intensity level when exercising independently       Knowledge and understanding of Target Heart Rate Range (THRR) Yes  Intervention Provide education and explanation of THRR including how the numbers were predicted and where they are located for reference       Expected Outcomes Short Term: Able to state/look up THRR;Long Term: Able to use THRR to govern intensity when exercising independently;Short Term: Able to use daily as guideline for intensity in rehab       Able to check pulse independently Yes       Intervention Review the importance of being able to check your own pulse for safety during independent exercise;Provide education and demonstration on how to check pulse in carotid and radial arteries.       Expected Outcomes Short Term: Able to explain why pulse checking is important during independent exercise;Long Term: Able to check pulse independently and accurately       Understanding of Exercise  Prescription Yes       Intervention Provide education, explanation, and written materials on patient's individual exercise prescription       Expected Outcomes Short Term: Able to explain program exercise prescription;Long Term: Able to explain home exercise prescription to exercise independently

## 2023-11-17 NOTE — Progress Notes (Signed)
 Cardiac Individual Treatment Plan  Patient Details  Name: Randy Ingram MRN: [id number] Date of Birth: 1956-03-05 Referring Provider:   Flowsheet Row Cardiac Rehab from 11/17/2023 in [facility] Cardiac and Pulmonary Rehab  Referring Provider Dr. Murray Khan, MD    Initial Encounter Date:  Flowsheet Row Cardiac Rehab from 11/17/2023 in [facility] Cardiac and Pulmonary Rehab  Date 11/17/23    Visit Diagnosis: Status post coronary artery stent placement  Patient's Home Medications on Admission:  Current Outpatient Medications:    amLODipine  (NORVASC ) 10 MG tablet, Take 1 tablet (10 mg total) by mouth daily., Disp: 90 tablet, Rfl: 3   aspirin  EC 81 MG tablet, Take 81 mg by mouth in the morning. Swallow whole., Disp: , Rfl:    B COMPLEX VITAMINS PO, Take 1 tablet by mouth in the morning., Disp: , Rfl:    chlorthalidone  (HYGROTON ) 50 MG tablet, Take 50 mg by mouth in the morning., Disp: , Rfl:    cholecalciferol (VITAMIN D3) 25 MCG (1000 UNIT) tablet, Take 1,000 Units by mouth in the morning., Disp: , Rfl:    clopidogrel  (PLAVIX ) 75 MG tablet, Take 1 tablet (75 mg total) by mouth daily., Disp: 60 tablet, Rfl: 0   clopidogrel  (PLAVIX ) 75 MG tablet, Take 1 tablet (75 mg total) by mouth daily., Disp: 60 tablet, Rfl: 0   isosorbide  mononitrate (IMDUR ) 30 MG 24 hr tablet, Take 1 tablet (30 mg total) by mouth daily., Disp: 30 tablet, Rfl: 0   isosorbide  mononitrate (IMDUR ) 60 MG 24 hr tablet, Take 60 mg by mouth daily., Disp: , Rfl:    Krill Oil 1000 MG CAPS, Take 1,000 mg by mouth in the morning., Disp: , Rfl:    losartan  (COZAAR ) 100 MG tablet, Take 1 tablet (100 mg total) by mouth daily., Disp: 90 tablet, Rfl: 0   metoprolol succinate (TOPROL-XL) 25 MG 24 hr tablet, Take 25 mg by mouth in the morning., Disp: , Rfl:    OVER THE COUNTER MEDICATION, Take 1 Scoop by mouth in the morning. Oligomeric proanthocyanidins (OPC), Disp: , Rfl:    Potassium 99 MG TABS, Take 99 mg by mouth in the morning., Disp: , Rfl:     Resveratrol 100 MG CAPS, Take 100 mg by mouth in the morning., Disp: , Rfl:    rosuvastatin  (CRESTOR ) 40 MG tablet, Take 40 mg by mouth every evening., Disp: , Rfl:   Past Medical History: Past Medical History:  Diagnosis Date   Hypertension    controlled on meds    Tobacco Use: Social History   Tobacco Use  Smoking Status Never  Smokeless Tobacco Never    Labs: Review Flowsheet  More data exists      Latest Ref Rng & Units 10/28/2016 11/09/2017 11/29/2018 01/01/2020 02/20/2021  Labs for ITP Cardiac and Pulmonary Rehab  Cholestrol 100 - 199 mg/dL 803  799  801  802  814   LDL (calc) 0 - 99 mg/dL 878  869  871  880  887   HDL-C >39 mg/dL 46  47  45  46  46   Trlycerides 0 - 149 mg/dL 856  883  876  818  845   Hemoglobin A1c 4.8 - 5.6 % - - - - 5.5      Exercise Target Goals: Exercise Program Goal: Individual exercise prescription set using results from initial 6 min walk test and THRR while considering  patient's activity barriers and safety.   Exercise Prescription Goal: Initial exercise prescription builds to 30-45  minutes a day of aerobic activity, 2-3 days per week.  Home exercise guidelines will be given to patient during program as part of exercise prescription that the participant will acknowledge.   Education: Aerobic Exercise: - Group verbal and visual presentation on the components of exercise prescription. Introduces F.I.T.T principle from ACSM for exercise prescriptions.  Reviews F.I.T.T. principles of aerobic exercise including progression. Written material given at graduation. Flowsheet Row Cardiac Rehab from 11/17/2023 in [facility] Cardiac and Pulmonary Rehab  Education need identified 11/17/23    Education: Resistance Exercise: - Group verbal and visual presentation on the components of exercise prescription. Introduces F.I.T.T principle from ACSM for exercise prescriptions  Reviews F.I.T.T. principles of resistance exercise including progression. Written  material given at graduation.    Education: Exercise & Equipment Safety: - Individual verbal instruction and demonstration of equipment use and safety with use of the equipment. Flowsheet Row Cardiac Rehab from 11/17/2023 in [facility] Cardiac and Pulmonary Rehab  Date 11/17/23  Educator NT  Instruction Review Code 1- Verbalizes Understanding    Education: Exercise Physiology & General Exercise Guidelines: - Group verbal and written instruction with models to review the exercise physiology of the cardiovascular system and associated critical values. Provides general exercise guidelines with specific guidelines to those with heart or lung disease.    Education: Flexibility, Balance, Mind/Body Relaxation: - Group verbal and visual presentation with interactive activity on the components of exercise prescription. Introduces F.I.T.T principle from ACSM for exercise prescriptions. Reviews F.I.T.T. principles of flexibility and balance exercise training including progression. Also discusses the mind body connection.  Reviews various relaxation techniques to help reduce and manage stress (i.e. Deep breathing, progressive muscle relaxation, and visualization). Balance handout provided to take home. Written material given at graduation.   Activity Barriers & Risk Stratification:  Activity Barriers & Cardiac Risk Stratification - 11/11/23 1402       Activity Barriers & Cardiac Risk Stratification   Activity Barriers Other (comment)    Comments left foot plantar fascitis    Cardiac Risk Stratification Moderate          6 Minute Walk:  6 Minute Walk     Row Name 11/17/23 0916         6 Minute Walk   Phase Initial     Distance 1500 feet     Walk Time 6 minutes     # of Rest Breaks 0     MPH 2.84     METS 3.37     RPE 11     Perceived Dyspnea  0     VO2 Peak 11.8     Symptoms No     Resting HR 59 bpm     Resting BP 126/74     Resting Oxygen Saturation  96 %     Exercise Oxygen  Saturation  during 6 min walk 94 %     Max Ex. HR 98 bpm     Max Ex. BP 144/72     2 Minute Post BP 128/70        Oxygen Initial Assessment:   Oxygen Re-Evaluation:   Oxygen Discharge (Final Oxygen Re-Evaluation):   Initial Exercise Prescription:  Initial Exercise Prescription - 11/17/23 0900       Date of Initial Exercise RX and Referring Provider   Date 11/17/23    Referring Provider Dr. Murray Khan, MD      Oxygen   Maintain Oxygen Saturation 88% or higher      Treadmill  MPH 2.8    Grade 1    Minutes 15    METs 3.53      REL-XR   Level 3    Speed 50    Minutes 15    METs 3.37      Rower   Level 4    Watts 35    Minutes 15    METs 3.37      Prescription Details   Frequency (times per week) 3    Duration Progress to 30 minutes of continuous aerobic without signs/symptoms of physical distress      Intensity   THRR 40-80% of Max Heartrate 97-135    Ratings of Perceived Exertion 11-13    Perceived Dyspnea 0-4      Progression   Progression Continue to progress workloads to maintain intensity without signs/symptoms of physical distress.      Resistance Training   Training Prescription Yes    Weight 7 lb    Reps 10-15          Perform Capillary Blood Glucose checks as needed.  Exercise Prescription Changes:   Exercise Prescription Changes     Row Name 11/17/23 0900             Response to Exercise   Blood Pressure (Admit) 126/74       Blood Pressure (Exercise) 144/72       Blood Pressure (Exit) 128/70       Heart Rate (Admit) 59 bpm       Heart Rate (Exercise) 98 bpm       Heart Rate (Exit) 69 bpm       Oxygen Saturation (Admit) 96 %       Oxygen Saturation (Exercise) 94 %       Rating of Perceived Exertion (Exercise) 11       Perceived Dyspnea (Exercise) 0       Symptoms none       Comments Results          Exercise Comments:   Exercise Goals and Review:   Exercise Goals     Row Name 11/17/23 0917              Exercise Goals   Increase Physical Activity Yes       Intervention Provide advice, education, support and counseling about physical activity/exercise needs.;Develop an individualized exercise prescription for aerobic and resistive training based on initial evaluation findings, risk stratification, comorbidities and participant's personal goals.       Expected Outcomes Short Term: Attend rehab on a regular basis to increase amount of physical activity.;Long Term: Add in home exercise to make exercise part of routine and to increase amount of physical activity.;Long Term: Exercising regularly at least 3-5 days a week.       Increase Strength and Stamina Yes       Intervention Provide advice, education, support and counseling about physical activity/exercise needs.;Develop an individualized exercise prescription for aerobic and resistive training based on initial evaluation findings, risk stratification, comorbidities and participant's personal goals.       Expected Outcomes Long Term: Improve cardiorespiratory fitness, muscular endurance and strength as measured by increased METs and functional capacity ( );Short Term: Increase workloads from initial exercise prescription for resistance, speed, and METs.;Short Term: Perform resistance training exercises routinely during rehab and add in resistance training at home       Able to understand and use rate of perceived exertion (RPE) scale Yes  Intervention Provide education and explanation on how to use RPE scale       Expected Outcomes Short Term: Able to use RPE daily in rehab to express subjective intensity level;Long Term:  Able to use RPE to guide intensity level when exercising independently       Able to understand and use Dyspnea scale Yes       Intervention Provide education and explanation on how to use Dyspnea scale       Expected Outcomes Short Term: Able to use Dyspnea scale daily in rehab to express subjective sense of shortness of  breath during exertion;Long Term: Able to use Dyspnea scale to guide intensity level when exercising independently       Knowledge and understanding of Target Heart Rate Range (THRR) Yes       Intervention Provide education and explanation of THRR including how the numbers were predicted and where they are located for reference       Expected Outcomes Short Term: Able to state/look up THRR;Long Term: Able to use THRR to govern intensity when exercising independently;Short Term: Able to use daily as guideline for intensity in rehab       Able to check pulse independently Yes       Intervention Review the importance of being able to check your own pulse for safety during independent exercise;Provide education and demonstration on how to check pulse in carotid and radial arteries.       Expected Outcomes Short Term: Able to explain why pulse checking is important during independent exercise;Long Term: Able to check pulse independently and accurately       Understanding of Exercise Prescription Yes       Intervention Provide education, explanation, and written materials on patient's individual exercise prescription       Expected Outcomes Short Term: Able to explain program exercise prescription;Long Term: Able to explain home exercise prescription to exercise independently          Exercise Goals Re-Evaluation :   Discharge Exercise Prescription (Final Exercise Prescription Changes):  Exercise Prescription Changes - 11/17/23 0900       Response to Exercise   Blood Pressure (Admit) 126/74    Blood Pressure (Exercise) 144/72    Blood Pressure (Exit) 128/70    Heart Rate (Admit) 59 bpm    Heart Rate (Exercise) 98 bpm    Heart Rate (Exit) 69 bpm    Oxygen Saturation (Admit) 96 %    Oxygen Saturation (Exercise) 94 %    Rating of Perceived Exertion (Exercise) 11    Perceived Dyspnea (Exercise) 0    Symptoms none    Comments Results          Nutrition:  Target Goals: Understanding  of nutrition guidelines, daily intake of sodium 1500mg , cholesterol 200mg , calories 30% from fat and 7% or less from saturated fats, daily to have 5 or more servings of fruits and vegetables.  Education: All About Nutrition: -Group instruction provided by verbal, written material, interactive activities, discussions, models, and posters to present general guidelines for heart healthy nutrition including fat, fiber, MyPlate, the role of sodium in heart healthy nutrition, utilization of the nutrition label, and utilization of this knowledge for meal planning. Follow up email sent as well. Written material given at graduation. Flowsheet Row Cardiac Rehab from 11/17/2023 in [facility] Cardiac and Pulmonary Rehab  Education need identified 11/17/23    Biometrics:  Pre Biometrics - 11/17/23 9081       Pre Biometrics  Height 5' 9.5 (1.765 m)    Weight 212 lb 1.6 oz (96.2 kg)    Waist Circumference 42 inches    Hip Circumference 40 inches    Waist to Hip Ratio 1.05 %    BMI (Calculated) 30.88    Single Leg Stand 30 seconds           Nutrition Therapy Plan and Nutrition Goals:  Nutrition Therapy & Goals - 11/17/23 0913       Nutrition Therapy   RD appointment deferred Yes      Intervention Plan   Intervention Prescribe, educate and counsel regarding individualized specific dietary modifications aiming towards targeted core components such as weight, hypertension, lipid management, diabetes, heart failure and other comorbidities.    Expected Outcomes Short Term Goal: Understand basic principles of dietary content, such as calories, fat, sodium, cholesterol and nutrients.;Short Term Goal: A plan has been developed with personal nutrition goals set during dietitian appointment.;Long Term Goal: Adherence to prescribed nutrition plan.          Nutrition Assessments:  MEDIFICTS Score Key: >=70 Need to make dietary changes  40-70 Heart Healthy Diet <= 40 Therapeutic Level Cholesterol  Diet  Flowsheet Row Cardiac Rehab from 11/17/2023 in [facility] Cardiac and Pulmonary Rehab  Picture Your Plate Total Score on Admission 67   Picture Your Plate Scores: <59 Unhealthy dietary pattern with much room for improvement. 41-50 Dietary pattern unlikely to meet recommendations for good health and room for improvement. 51-60 More healthful dietary pattern, with some room for improvement.  >60 Healthy dietary pattern, although there may be some specific behaviors that could be improved.    Nutrition Goals Re-Evaluation:   Nutrition Goals Discharge (Final Nutrition Goals Re-Evaluation):   Psychosocial: Target Goals: Acknowledge presence or absence of significant depression and/or stress, maximize coping skills, provide positive support system. Participant is able to verbalize types and ability to use techniques and skills needed for reducing stress and depression.   Education: Stress, Anxiety, and Depression - Group verbal and visual presentation to define topics covered.  Reviews how body is impacted by stress, anxiety, and depression.  Also discusses healthy ways to reduce stress and to treat/manage anxiety and depression.  Written material given at graduation.   Education: Sleep Hygiene -Provides group verbal and written instruction about how sleep can affect your health.  Define sleep hygiene, discuss sleep cycles and impact of sleep habits. Review good sleep hygiene tips.    Initial Review & Psychosocial Screening:  Initial Psych Review & Screening - 11/11/23 1412       Initial Review   Current issues with None Identified      Family Dynamics   Good Support System? Yes   family     Barriers   Psychosocial barriers to participate in program There are no identifiable barriers or psychosocial needs.;The patient should benefit from training in stress management and relaxation.      Screening Interventions   Interventions To provide support and resources with identified  psychosocial needs;Encouraged to exercise;Provide feedback about the scores to participant    Expected Outcomes Short Term goal: Utilizing psychosocial counselor, staff and physician to assist with identification of specific Stressors or current issues interfering with healing process. Setting desired goal for each stressor or current issue identified.;Long Term Goal: Stressors or current issues are controlled or eliminated.;Short Term goal: Identification and review with participant of any Quality of Life or Depression concerns found by scoring the questionnaire.;Long Term goal: The participant improves quality of  Life and PHQ9 Scores as seen by post scores and/or verbalization of changes          Quality of Life Scores:   Quality of Life - 11/17/23 0911       Quality of Life   Select Quality of Life      Quality of Life Scores   Health/Function Pre 27.6 %    Socioeconomic Pre 27.38 %    Psych/Spiritual Pre 28.29 %    Family Pre 30 %    GLOBAL Pre 28.03 %         Scores of 19 and below usually indicate a poorer quality of life in these areas.  A difference of  2-3 points is a clinically meaningful difference.  A difference of 2-3 points in the total score of the Quality of Life Index has been associated with significant improvement in overall quality of life, self-image, physical symptoms, and general health in studies assessing change in quality of life.  PHQ-9: Review Flowsheet  More data exists      11/17/2023 08/18/2020 01/01/2020 11/27/2018 11/08/2017  Depression screen PHQ 2/9  Decreased Interest 0 0 0 0 0 0  Down, Depressed, Hopeless 0 0 0 0 0  PHQ - 2 Score 0 0 0 0 0 0  Altered sleeping 0 0 0 - -  Tired, decreased energy 0 0 0 - -  Change in appetite 0 0 0 - -  Feeling bad or failure about yourself  0 0 0 - -  Trouble concentrating 0 0 0 - -  Moving slowly or fidgety/restless 0 0 0 - -  Suicidal thoughts 0 0 0 - -  PHQ-9 Score 0 0 0 - -  Difficult doing work/chores - Not  difficult at all Not difficult at all - -    Details       Multiple values from one day are sorted in reverse-chronological order        Interpretation of Total Score  Total Score Depression Severity:  1-4 = Minimal depression, 5-9 = Mild depression, 10-14 = Moderate depression, 15-19 = Moderately severe depression, 20-27 = Severe depression   Psychosocial Evaluation and Intervention:  Psychosocial Evaluation - 11/11/23 1417       Psychosocial Evaluation & Interventions   Interventions Encouraged to exercise with the program and follow exercise prescription    Comments Randy Ingram is coming to cardiac rehab post stents. He states he has been feeling well and already back to work. He states he has no stress concerns at this time and sleeps very well. He is looking forward to attending the program to learn more about heart healthy living    Expected Outcomes Short: attend cardiac rehab for education and exercise Long: develop and maintain positive self care habits    Continue Psychosocial Services  Follow up required by staff          Psychosocial Re-Evaluation:   Psychosocial Discharge (Final Psychosocial Re-Evaluation):   Vocational Rehabilitation: Provide vocational rehab assistance to qualifying candidates.   Vocational Rehab Evaluation & Intervention:   Education: Education Goals: Education classes will be provided on a variety of topics geared toward better understanding of heart health and risk factor modification. Participant will state understanding/return demonstration of topics presented as noted by education test scores.  Learning Barriers/Preferences:  Learning Barriers/Preferences - 11/11/23 1407       Learning Barriers/Preferences   Learning Barriers None    Learning Preferences None  General Cardiac Education Topics:  AED/CPR: - Group verbal and written instruction with the use of models to demonstrate the basic use of the AED with the  basic ABC's of resuscitation.   Anatomy and Cardiac Procedures: - Group verbal and visual presentation and models provide information about basic cardiac anatomy and function. Reviews the testing methods done to diagnose heart disease and the outcomes of the test results. Describes the treatment choices: Medical Management, Angioplasty, or Coronary Bypass Surgery for treating various heart conditions including Myocardial Infarction, Angina, Valve Disease, and Cardiac Arrhythmias.  Written material given at graduation.   Medication Safety: - Group verbal and visual instruction to review commonly prescribed medications for heart and lung disease. Reviews the medication, class of the drug, and side effects. Includes the steps to properly store meds and maintain the prescription regimen.  Written material given at graduation.   Intimacy: - Group verbal instruction through game format to discuss how heart and lung disease can affect sexual intimacy. Written material given at graduation..   Know Your Numbers and Heart Failure: - Group verbal and visual instruction to discuss disease risk factors for cardiac and pulmonary disease and treatment options.  Reviews associated critical values for Overweight/Obesity, Hypertension, Cholesterol, and Diabetes.  Discusses basics of heart failure: signs/symptoms and treatments.  Introduces Heart Failure Zone chart for action plan for heart failure.  Written material given at graduation.   Infection Prevention: - Provides verbal and written material to individual with discussion of infection control including proper hand washing and proper equipment cleaning during exercise session. Flowsheet Row Cardiac Rehab from 11/17/2023 in [facility] Cardiac and Pulmonary Rehab  Date 11/17/23  Educator NT  Instruction Review Code 1- Verbalizes Understanding    Falls Prevention: - Provides verbal and written material to individual with discussion of falls prevention and  safety. Flowsheet Row Cardiac Rehab from 11/17/2023 in [facility] Cardiac and Pulmonary Rehab  Date 11/17/23  Educator NT  Instruction Review Code 1- Verbalizes Understanding    Other: -Provides group and verbal instruction on various topics (see comments)   Knowledge Questionnaire Score:  Knowledge Questionnaire Score - 11/17/23 0911       Knowledge Questionnaire Score   Pre Score 23/26          Core Components/Risk Factors/Patient Goals at Admission:  Personal Goals and Risk Factors at Admission - 11/11/23 1405       Core Components/Risk Factors/Patient Goals on Admission    Weight Management Yes;Weight Loss    Intervention Weight Management: Develop a combined nutrition and exercise program designed to reach desired caloric intake, while maintaining appropriate intake of nutrient and fiber, sodium and fats, and appropriate energy expenditure required for the weight goal.;Weight Management: Provide education and appropriate resources to help participant work on and attain dietary goals.;Weight Management/Obesity: Establish reasonable short term and long term weight goals.    Expected Outcomes Short Term: Continue to assess and modify interventions until short term weight is achieved;Long Term: Adherence to nutrition and physical activity/exercise program aimed toward attainment of established weight goal;Weight Loss: Understanding of general recommendations for a balanced deficit meal plan, which promotes 1-2 lb weight loss per week and includes a negative energy balance of [phone] kcal/d;Understanding recommendations for meals to include 15-35% energy as protein, 25-35% energy from fat, 35-60% energy from carbohydrates, less than 200mg  of dietary cholesterol, 20-35 gm of total fiber daily;Understanding of distribution of calorie intake throughout the day with the consumption of 4-5 meals/snacks    Hypertension Yes  Intervention Provide education on lifestyle modifcations including  regular physical activity/exercise, weight management, moderate sodium restriction and increased consumption of fresh fruit, vegetables, and low fat dairy, alcohol moderation, and smoking cessation.;Monitor prescription use compliance.    Expected Outcomes Short Term: Continued assessment and intervention until BP is < 140/28mm HG in hypertensive participants. < 130/80mm HG in hypertensive participants with diabetes, heart failure or chronic kidney disease.;Long Term: Maintenance of blood pressure at goal levels.    Lipids Yes    Intervention Provide education and support for participant on nutrition & aerobic/resistive exercise along with prescribed medications to achieve LDL 70mg , HDL >40mg .    Expected Outcomes Short Term: Participant states understanding of desired cholesterol values and is compliant with medications prescribed. Participant is following exercise prescription and nutrition guidelines.;Long Term: Cholesterol controlled with medications as prescribed, with individualized exercise RX and with personalized nutrition plan. Value goals: LDL < 70mg , HDL > 40 mg.          Education:Diabetes - Individual verbal and written instruction to review signs/symptoms of diabetes, desired ranges of glucose level fasting, after meals and with exercise. Acknowledge that pre and post exercise glucose checks will be done for 3 sessions at entry of program.   Core Components/Risk Factors/Patient Goals Review:    Core Components/Risk Factors/Patient Goals at Discharge (Final Review):    ITP Comments:  ITP Comments     Row Name 11/11/23 1420 11/17/23 0908         ITP Comments Initial phone call completed. Diagnosis can be found in [facility] 6/23. EP Orientation scheduled for Thursday 7/31 at 8am. Completed and gym orientation for cardiac rehab. Initial ITP created and sent for review to Dr. Oneil Pinal, Medical Director.         Comments: Initial ITP

## 2023-11-22 ENCOUNTER — Encounter: Payer: Self-pay | Attending: Internal Medicine

## 2023-11-22 DIAGNOSIS — Z955 Presence of coronary angioplasty implant and graft: Secondary | ICD-10-CM | POA: Diagnosis present

## 2023-11-22 DIAGNOSIS — Z48812 Encounter for surgical aftercare following surgery on the circulatory system: Secondary | ICD-10-CM | POA: Diagnosis not present

## 2023-11-22 NOTE — Progress Notes (Signed)
 Daily Session Note  Patient Details  Name: NEVIN GRIZZLE MRN: [id number] Date of Birth: 26-Aug-1955 Referring Provider:   Flowsheet Row Cardiac Rehab from 11/17/2023 in [facility] Cardiac and Pulmonary Rehab  Referring Provider Dr. Murray Khan, MD    Encounter Date: 11/22/2023  Check In:  Session Check In - 11/22/23 0734       Check-In   Supervising physician immediately available to respond to emergencies See telemetry face sheet for immediately available ER MD    Location [facility]-Cardiac & Pulmonary Rehab    Staff Present Burnard Davenport [facility] Best, MS, Exercise Physiologist    Virtual Visit No    Medication changes reported     No    Fall or balance concerns reported    No    Warm-up and Cool-down Performed on first and last piece of equipment    Resistance Training Performed Yes    VAD Patient? No    PAD/SET Patient? No      Pain Assessment   Currently in Pain? No/denies             Social History   Tobacco Use  Smoking Status Never  Smokeless Tobacco Never    Goals Met:  Independence with exercise equipment Exercise tolerated well No report of concerns or symptoms today Strength training completed today  Goals Unmet:  Not Applicable  Comments: First full day of exercise!  Patient was oriented to gym and equipment including functions, settings, policies, and procedures.  Patient's individual exercise prescription and treatment plan were reviewed.  All starting workloads were established based on the results of the 6 minute walk test done at initial orientation visit.  The plan for exercise progression was also introduced and progression will be customized based on patient's performance and goals.    Dr. Oneil Pinal is Medical Director for [facility] Cardiac Rehabilitation.  Dr. Fuad Aleskerov is Medical Director for [facility] Pulmonary Rehabilitation.

## 2023-11-24 ENCOUNTER — Encounter: Payer: Self-pay | Admitting: *Deleted

## 2023-11-24 DIAGNOSIS — Z955 Presence of coronary angioplasty implant and graft: Secondary | ICD-10-CM

## 2023-11-24 NOTE — Progress Notes (Signed)
 Daily Session Note  Patient Details  Name: Randy Ingram MRN: [id number] Date of Birth: 1955/10/31 Referring Provider:   Flowsheet Row Cardiac Rehab from 11/17/2023 in [facility] Cardiac and Pulmonary Rehab  Referring Provider Dr. Murray Khan, MD    Encounter Date: 11/24/2023  Check In:  Session Check In - 11/24/23 0838       Check-In   Supervising physician immediately available to respond to emergencies See telemetry face sheet for immediately available ER MD    Location [facility]-Cardiac & Pulmonary Rehab    Staff Present Othel Durand, RN, BSN, CCRP;Margaret Best, MS, Exercise Physiologist;Joseph Hood RCP,RRT,BSRT;Other   Leita Franks RN BSN   Virtual Visit Yes    Medication changes reported     Yes    Fall or balance concerns reported    No    Warm-up and Cool-down Performed on first and last piece of equipment    Resistance Training Performed Yes    VAD Patient? No    PAD/SET Patient? No      Pain Assessment   Currently in Pain? No/denies             Social History   Tobacco Use  Smoking Status Never  Smokeless Tobacco Never    Goals Met:  Independence with exercise equipment Exercise tolerated well No report of concerns or symptoms today  Goals Unmet:  Not Applicable  Comments: Pt able to follow exercise prescription today without complaint.  Will continue to monitor for progression.    Dr. Oneil Pinal is Medical Director for [facility] Cardiac Rehabilitation.  Dr. Fuad Aleskerov is Medical Director for [facility] Pulmonary Rehabilitation.

## 2023-11-25 ENCOUNTER — Encounter: Payer: Self-pay | Admitting: *Deleted

## 2023-11-25 DIAGNOSIS — Z955 Presence of coronary angioplasty implant and graft: Secondary | ICD-10-CM | POA: Diagnosis not present

## 2023-11-25 NOTE — Progress Notes (Signed)
 Daily Session Note  Patient Details  Name: Randy Ingram MRN: [id number] Date of Birth: 1956-03-26 Referring Provider:   Flowsheet Row Cardiac Rehab from 11/17/2023 in [facility] Cardiac and Pulmonary Rehab  Referring Provider Dr. Murray Khan, MD    Encounter Date: 11/25/2023  Check In:  Session Check In - 11/25/23 0803       Check-In   Supervising physician immediately available to respond to emergencies See telemetry face sheet for immediately available ER MD    Location [facility]-Cardiac & Pulmonary Rehab    Staff Present Othel Durand, RN, BSN, CCRP;Joseph Hood RCP,RRT,BSRT;Maxon [place]its[place] BS, Exercise Physiologist    Virtual Visit No    Medication changes reported     No    Fall or balance concerns reported    No    Warm-up and Cool-down Performed on first and last piece of equipment    Resistance Training Performed Yes    VAD Patient? No      Pain Assessment   Currently in Pain? No/denies             Social History   Tobacco Use  Smoking Status Never  Smokeless Tobacco Never    Goals Met:  Independence with exercise equipment Exercise tolerated well No report of concerns or symptoms today  Goals Unmet:  Not Applicable  Comments: Pt able to follow exercise prescription today without complaint.  Will continue to monitor for progression.    Dr. Oneil Pinal is Medical Director for [facility] Cardiac Rehabilitation.  Dr. Fuad Aleskerov is Medical Director for [facility] Pulmonary Rehabilitation.

## 2023-11-29 DIAGNOSIS — Z955 Presence of coronary angioplasty implant and graft: Secondary | ICD-10-CM | POA: Diagnosis not present

## 2023-11-29 NOTE — Progress Notes (Signed)
 Daily Session Note  Patient Details  Name: Randy Ingram MRN: [id number] Date of Birth: 1955/07/08 Referring Provider:   Flowsheet Row Cardiac Rehab from 11/17/2023 in [facility] Cardiac and Pulmonary Rehab  Referring Provider Dr. Murray Khan, MD    Encounter Date: 11/29/2023  Check In:  Session Check In - 11/29/23 0725       Check-In   Supervising physician immediately available to respond to emergencies See telemetry face sheet for immediately available ER MD    Location [facility]-Cardiac & Pulmonary Rehab    Staff Present Burnard Davenport RN,BSN,MPA;Margaret Best, MS, Exercise Physiologist;Jason Elnor RDN,LDN    Virtual Visit No    Medication changes reported     No    Fall or balance concerns reported    No    Warm-up and Cool-down Performed on first and last piece of equipment    Resistance Training Performed Yes    VAD Patient? No    PAD/SET Patient? No      Pain Assessment   Currently in Pain? No/denies             Social History   Tobacco Use  Smoking Status Never  Smokeless Tobacco Never    Goals Met:  Independence with exercise equipment Exercise tolerated well No report of concerns or symptoms today Strength training completed today  Goals Unmet:  Not Applicable  Comments: Pt able to follow exercise prescription today without complaint.  Will continue to monitor for progression.    Dr. Oneil Pinal is Medical Director for [facility] Cardiac Rehabilitation.  Dr. Fuad Aleskerov is Medical Director for [facility] Pulmonary Rehabilitation.

## 2023-12-01 ENCOUNTER — Encounter: Payer: Self-pay | Admitting: Emergency Medicine

## 2023-12-01 DIAGNOSIS — Z955 Presence of coronary angioplasty implant and graft: Secondary | ICD-10-CM

## 2023-12-01 NOTE — Progress Notes (Signed)
 Daily Session Note  Patient Details  Name: Randy Ingram MRN: [id number] Date of Birth: 29-Aug-1955 Referring Provider:   Flowsheet Row Cardiac Rehab from 11/17/2023 in [facility] Cardiac and Pulmonary Rehab  Referring Provider Dr. Murray Khan, MD    Encounter Date: 12/01/2023  Check In:  Session Check In - 12/01/23 0731       Check-In   Supervising physician immediately available to respond to emergencies See telemetry face sheet for immediately available ER MD    Location [facility]-Cardiac & Pulmonary Rehab    Staff Present Fairy Plater RCP,RRT,BSRT;Margaret Best, MS, Exercise Physiologist;Susanne Bice, RN, BSN, CCRP    Virtual Visit No    Medication changes reported     No    Fall or balance concerns reported    No    Warm-up and Cool-down Performed on first and last piece of equipment    Resistance Training Performed Yes    VAD Patient? No    PAD/SET Patient? No      Pain Assessment   Currently in Pain? No/denies             Social History   Tobacco Use  Smoking Status Never  Smokeless Tobacco Never    Goals Met:  Independence with exercise equipment Exercise tolerated well No report of concerns or symptoms today Strength training completed today  Goals Unmet:  Not Applicable  Comments: Pt able to follow exercise prescription today without complaint.  Will continue to monitor for progression.    Dr. Oneil Pinal is Medical Director for [facility] Cardiac Rehabilitation.  Dr. Fuad Aleskerov is Medical Director for [facility] Pulmonary Rehabilitation.

## 2023-12-02 DIAGNOSIS — Z955 Presence of coronary angioplasty implant and graft: Secondary | ICD-10-CM

## 2023-12-02 NOTE — Progress Notes (Signed)
 Daily Session Note  Patient Details  Name: Randy Ingram MRN: [id number] Date of Birth: 1956/03/10 Referring Provider:   Flowsheet Row Cardiac Rehab from 11/17/2023 in [facility] Cardiac and Pulmonary Rehab  Referring Provider Dr. Murray Khan, MD    Encounter Date: 12/02/2023  Check In:  Session Check In - 12/02/23 0730       Check-In   Supervising physician immediately available to respond to emergencies See telemetry face sheet for immediately available ER MD    Location [facility]-Cardiac & Pulmonary Rehab    Staff Present Burnard Davenport RN,BSN,MPA;Joseph Rolinda NORWOOD HARMAN Verlie Laird, [place], Exercise Physiologist    Virtual Visit No    Medication changes reported     No    Fall or balance concerns reported    No    Warm-up and Cool-down Performed on first and last piece of equipment    Resistance Training Performed Yes    VAD Patient? No    PAD/SET Patient? No      Pain Assessment   Currently in Pain? No/denies             Social History   Tobacco Use  Smoking Status Never  Smokeless Tobacco Never    Goals Met:  Independence with exercise equipment Exercise tolerated well No report of concerns or symptoms today Strength training completed today  Goals Unmet:  Not Applicable  Comments: Pt able to follow exercise prescription today without complaint.  Will continue to monitor for progression.    Dr. Oneil Pinal is Medical Director for [facility] Cardiac Rehabilitation.  Dr. Fuad Aleskerov is Medical Director for [facility] Pulmonary Rehabilitation.

## 2023-12-06 DIAGNOSIS — Z955 Presence of coronary angioplasty implant and graft: Secondary | ICD-10-CM | POA: Diagnosis not present

## 2023-12-06 NOTE — Progress Notes (Signed)
 Daily Session Note  Patient Details  Name: Randy Ingram MRN: [id number] Date of Birth: 1955-08-13 Referring Provider:   Flowsheet Row Cardiac Rehab from 11/17/2023 in [facility] Cardiac and Pulmonary Rehab  Referring Provider Dr. Murray Khan, MD    Encounter Date: 12/06/2023  Check In:  Session Check In - 12/06/23 0725       Check-In   Supervising physician immediately available to respond to emergencies See telemetry face sheet for immediately available ER MD    Location [facility]-Cardiac & Pulmonary Rehab    Staff Present Burnard Davenport RN,BSN,MPA;Noah Tickle, BS, Exercise Physiologist;Jason Elnor [facility]    Virtual Visit No    Medication changes reported     No    Fall or balance concerns reported    No    Warm-up and Cool-down Performed on first and last piece of equipment    Resistance Training Performed Yes    VAD Patient? No    PAD/SET Patient? No      Pain Assessment   Currently in Pain? No/denies             Social History   Tobacco Use  Smoking Status Never  Smokeless Tobacco Never    Goals Met:  Independence with exercise equipment Exercise tolerated well No report of concerns or symptoms today Strength training completed today  Goals Unmet:  Not Applicable  Comments: Pt able to follow exercise prescription today without complaint.  Will continue to monitor for progression.    Dr. Oneil Pinal is Medical Director for [facility] Cardiac Rehabilitation.  Dr. Fuad Aleskerov is Medical Director for [facility] Pulmonary Rehabilitation.

## 2023-12-08 ENCOUNTER — Encounter: Payer: Self-pay | Admitting: Emergency Medicine

## 2023-12-08 DIAGNOSIS — Z955 Presence of coronary angioplasty implant and graft: Secondary | ICD-10-CM | POA: Diagnosis not present

## 2023-12-08 NOTE — Progress Notes (Signed)
 Daily Session Note  Patient Details  Name: Randy Ingram MRN: [id number] Date of Birth: April 12, 1956 Referring Provider:   Flowsheet Row Cardiac Rehab from 11/17/2023 in [facility] Cardiac and Pulmonary Rehab  Referring Provider Dr. Murray Khan, MD    Encounter Date: 12/08/2023  Check In:  Session Check In - 12/08/23 0732       Check-In   Supervising physician immediately available to respond to emergencies See telemetry face sheet for immediately available ER MD    Location [facility]-Cardiac & Pulmonary Rehab    Staff Present Othel Durand, RN, BSN, CCRP;Joseph Hood RCP,RRT,BSRT;Fenix Ruppe RN,BSN    Virtual Visit No    Medication changes reported     No    Fall or balance concerns reported    No    Warm-up and Cool-down Performed on first and last piece of equipment    Resistance Training Performed Yes    VAD Patient? No    PAD/SET Patient? No      Pain Assessment   Currently in Pain? No/denies             Social History   Tobacco Use  Smoking Status Never  Smokeless Tobacco Never    Goals Met:  Independence with exercise equipment Exercise tolerated well No report of concerns or symptoms today Strength training completed today  Goals Unmet:  Not Applicable  Comments: Pt able to follow exercise prescription today without complaint.  Will continue to monitor for progression.    Dr. Oneil Pinal is Medical Director for [facility] Cardiac Rehabilitation.  Dr. Fuad Aleskerov is Medical Director for [facility] Pulmonary Rehabilitation.

## 2023-12-09 DIAGNOSIS — Z955 Presence of coronary angioplasty implant and graft: Secondary | ICD-10-CM | POA: Diagnosis not present

## 2023-12-09 NOTE — Progress Notes (Signed)
 Daily Session Note  Patient Details  Name: Randy Ingram MRN: [id number] Date of Birth: [birth year] Referring Provider:   Flowsheet Row Cardiac Rehab from 11/17/2023 in [facility] Cardiac and Pulmonary Rehab  Referring Provider Dr. Murray Khan, MD    Encounter Date: 12/09/2023  Check In:  Session Check In - 12/09/23 0733       Check-In   Supervising physician immediately available to respond to emergencies See telemetry face sheet for immediately available ER MD    Location [facility]-Cardiac & Pulmonary Rehab    Staff Present Burnard Davenport RN,BSN,MPA;Joseph Rolinda RCP,RRT,BSRT;Noah Tickle, [place], Exercise Physiologist    Virtual Visit No    Medication changes reported     No    Fall or balance concerns reported    No    Warm-up and Cool-down Performed on first and last piece of equipment    Resistance Training Performed Yes    VAD Patient? No    PAD/SET Patient? No      Pain Assessment   Currently in Pain? No/denies             Social History   Tobacco Use  Smoking Status Never  Smokeless Tobacco Never    Goals Met:  Independence with exercise equipment Exercise tolerated well No report of concerns or symptoms today Strength training completed today  Goals Unmet:  Not Applicable  Comments: Pt able to follow exercise prescription today without complaint.  Will continue to monitor for progression.    Dr. Oneil Pinal is Medical Director for [facility] Cardiac Rehabilitation.  Dr. Fuad Aleskerov is Medical Director for [facility] Pulmonary Rehabilitation.

## 2023-12-14 DIAGNOSIS — Z955 Presence of coronary angioplasty implant and graft: Secondary | ICD-10-CM

## 2023-12-14 NOTE — Progress Notes (Signed)
 Cardiac Individual Treatment Plan  Patient Details  Name: Randy Ingram MRN: [id number] Date of Birth: 10/25/1955 Referring Provider:   Flowsheet Row Cardiac Rehab from 11/17/2023 in [facility] Cardiac and Pulmonary Rehab  Referring Provider Dr. Murray Khan, MD    Initial Encounter Date:  Flowsheet Row Cardiac Rehab from 11/17/2023 in [facility] Cardiac and Pulmonary Rehab  Date 11/17/23    Visit Diagnosis: Status post coronary artery stent placement  Patient's Home Medications on Admission:  Current Outpatient Medications:    amLODipine  (NORVASC ) 10 MG tablet, Take 1 tablet (10 mg total) by mouth daily., Disp: 90 tablet, Rfl: 3   aspirin  EC 81 MG tablet, Take 81 mg by mouth in the morning. Swallow whole., Disp: , Rfl:    B COMPLEX VITAMINS PO, Take 1 tablet by mouth in the morning., Disp: , Rfl:    chlorthalidone  (HYGROTON ) 50 MG tablet, Take 50 mg by mouth in the morning., Disp: , Rfl:    cholecalciferol (VITAMIN D3) 25 MCG (1000 UNIT) tablet, Take 1,000 Units by mouth in the morning., Disp: , Rfl:    clopidogrel  (PLAVIX ) 75 MG tablet, Take 1 tablet (75 mg total) by mouth daily., Disp: 60 tablet, Rfl: 0   clopidogrel  (PLAVIX ) 75 MG tablet, Take 1 tablet (75 mg total) by mouth daily., Disp: 60 tablet, Rfl: 0   isosorbide  mononitrate (IMDUR ) 30 MG 24 hr tablet, Take 1 tablet (30 mg total) by mouth daily., Disp: 30 tablet, Rfl: 0   isosorbide  mononitrate (IMDUR ) 60 MG 24 hr tablet, Take 60 mg by mouth daily., Disp: , Rfl:    Krill Oil 1000 MG CAPS, Take 1,000 mg by mouth in the morning., Disp: , Rfl:    losartan  (COZAAR ) 100 MG tablet, Take 1 tablet (100 mg total) by mouth daily., Disp: 90 tablet, Rfl: 0   metoprolol succinate (TOPROL-XL) 25 MG 24 hr tablet, Take 25 mg by mouth in the morning., Disp: , Rfl:    OVER THE COUNTER MEDICATION, Take 1 Scoop by mouth in the morning. Oligomeric proanthocyanidins (OPC), Disp: , Rfl:    Potassium 99 MG TABS, Take 99 mg by mouth in the morning., Disp: , Rfl:     Resveratrol 100 MG CAPS, Take 100 mg by mouth in the morning., Disp: , Rfl:    rosuvastatin  (CRESTOR ) 40 MG tablet, Take 40 mg by mouth every evening., Disp: , Rfl:   Past Medical History: Past Medical History:  Diagnosis Date   Hypertension    controlled on meds    Tobacco Use: Social History   Tobacco Use  Smoking Status Never  Smokeless Tobacco Never    Labs: Review Flowsheet  More data exists      Latest Ref Rng & Units 10/28/2016 11/09/2017 11/29/2018 01/01/2020 02/20/2021  Labs for ITP Cardiac and Pulmonary Rehab  Cholestrol 100 - 199 mg/dL 803  799  801  802  814   LDL (calc) 0 - 99 mg/dL 878  869  871  880  887   HDL-C >39 mg/dL 46  47  45  46  46   Trlycerides 0 - 149 mg/dL 856  883  876  818  845   Hemoglobin A1c 4.8 - 5.6 % - - - - 5.5      Exercise Target Goals: Exercise Program Goal: Individual exercise prescription set using results from initial 6 min walk test and THRR while considering  patient's activity barriers and safety.   Exercise Prescription Goal: Initial exercise prescription builds to 30-45  minutes a day of aerobic activity, 2-3 days per week.  Home exercise guidelines will be given to patient during program as part of exercise prescription that the participant will acknowledge.   Education: Aerobic Exercise: - Group verbal and visual presentation on the components of exercise prescription. Introduces F.I.T.T principle from ACSM for exercise prescriptions.  Reviews F.I.T.T. principles of aerobic exercise including progression. Written material provided at class time. Flowsheet Row Cardiac Rehab from 11/17/2023 in [facility] Cardiac and Pulmonary Rehab  Education need identified 11/17/23    Education: Resistance Exercise: - Group verbal and visual presentation on the components of exercise prescription. Introduces F.I.T.T principle from ACSM for exercise prescriptions  Reviews F.I.T.T. principles of resistance exercise including progression. Written  material provided at class time.    Education: Exercise & Equipment Safety: - Individual verbal instruction and demonstration of equipment use and safety with use of the equipment. Flowsheet Row Cardiac Rehab from 11/17/2023 in [facility] Cardiac and Pulmonary Rehab  Date 11/17/23  Educator NT  Instruction Review Code 1- Verbalizes Understanding    Education: Exercise Physiology & General Exercise Guidelines: - Group verbal and written instruction with models to review the exercise physiology of the cardiovascular system and associated critical values. Provides general exercise guidelines with specific guidelines to those with heart or lung disease. Written material provided at class time.   Education: Flexibility, Balance, Mind/Body Relaxation: - Group verbal and visual presentation with interactive activity on the components of exercise prescription. Introduces F.I.T.T principle from ACSM for exercise prescriptions. Reviews F.I.T.T. principles of flexibility and balance exercise training including progression. Also discusses the mind body connection.  Reviews various relaxation techniques to help reduce and manage stress (i.e. Deep breathing, progressive muscle relaxation, and visualization). Balance handout provided to take home. Written material provided at class time.   Activity Barriers & Risk Stratification:  Activity Barriers & Cardiac Risk Stratification - 11/11/23 1402       Activity Barriers & Cardiac Risk Stratification   Activity Barriers Other (comment)    Comments left foot plantar fascitis    Cardiac Risk Stratification Moderate          6 Minute Walk:  6 Minute Walk     Row Name 11/17/23 0916         6 Minute Walk   Phase Initial     Distance 1500 feet     Walk Time 6 minutes     # of Rest Breaks 0     MPH 2.84     METS 3.37     RPE 11     Perceived Dyspnea  0     VO2 Peak 11.8     Symptoms No     Resting HR 59 bpm     Resting BP 126/74     Resting Oxygen  Saturation  96 %     Exercise Oxygen Saturation  during 6 min walk 94 %     Max Ex. HR 98 bpm     Max Ex. BP 144/72     2 Minute Post BP 128/70        Oxygen Initial Assessment:   Oxygen Re-Evaluation:   Oxygen Discharge (Final Oxygen Re-Evaluation):   Initial Exercise Prescription:  Initial Exercise Prescription - 11/17/23 0900       Date of Initial Exercise RX and Referring Provider   Date 11/17/23    Referring Provider Dr. Murray Khan, MD      Oxygen   Maintain Oxygen Saturation 88% or higher  Treadmill   MPH 2.8    Grade 1    Minutes 15    METs 3.53      REL-XR   Level 3    Speed 50    Minutes 15    METs 3.37      Rower   Level 4    Watts 35    Minutes 15    METs 3.37      Prescription Details   Frequency (times per week) 3    Duration Progress to 30 minutes of continuous aerobic without signs/symptoms of physical distress      Intensity   THRR 40-80% of Max Heartrate 97-135    Ratings of Perceived Exertion 11-13    Perceived Dyspnea 0-4      Progression   Progression Continue to progress workloads to maintain intensity without signs/symptoms of physical distress.      Resistance Training   Training Prescription Yes    Weight 7 lb    Reps 10-15          Perform Capillary Blood Glucose checks as needed.  Exercise Prescription Changes:   Exercise Prescription Changes     Row Name 11/17/23 0900 12/08/23 0900           Response to Exercise   Blood Pressure (Admit) 126/74 118/62      Blood Pressure (Exercise) 144/72 160/82      Blood Pressure (Exit) 128/70 102/62      Heart Rate (Admit) 59 bpm 60 bpm      Heart Rate (Exercise) 98 bpm 140 bpm      Heart Rate (Exit) 69 bpm 75 bpm      Oxygen Saturation (Admit) 96 % --      Oxygen Saturation (Exercise) 94 % --      Rating of Perceived Exertion (Exercise) 11 14      Perceived Dyspnea (Exercise) 0 0      Symptoms none none      Comments Results first 2 weeks of exercise       Duration -- Progress to 30 minutes of  aerobic without signs/symptoms of physical distress      Intensity -- THRR unchanged        Progression   Progression -- Continue to progress workloads to maintain intensity without signs/symptoms of physical distress.      Average METs -- 4.72        Resistance Training   Training Prescription -- Yes      Weight -- 7 lb      Reps -- 10-15        Interval Training   Interval Training -- No        Treadmill   MPH -- 3.1      Grade -- 2.5      Minutes -- 15      METs -- 4.44        REL-XR   Level -- 10      Minutes -- 15      METs -- 7.8        Rower   Level -- 10      Watts -- 60      Minutes -- 15      METs -- 4.8        Oxygen   Maintain Oxygen Saturation -- 88% or higher         Exercise Comments:   Exercise Comments     Row Name 11/22/23 [phone]  Exercise Comments First full day of exercise!  Patient was oriented to gym and equipment including functions, settings, policies, and procedures.  Patient's individual exercise prescription and treatment plan were reviewed.  All starting workloads were established based on the results of the 6 minute walk test done at initial orientation visit.  The plan for exercise progression was also introduced and progression will be customized based on patient's performance and goals.          Exercise Goals and Review:   Exercise Goals     Row Name 11/17/23 0917             Exercise Goals   Increase Physical Activity Yes       Intervention Provide advice, education, support and counseling about physical activity/exercise needs.;Develop an individualized exercise prescription for aerobic and resistive training based on initial evaluation findings, risk stratification, comorbidities and participant's personal goals.       Expected Outcomes Short Term: Attend rehab on a regular basis to increase amount of physical activity.;Long Term: Add in home exercise to make exercise part  of routine and to increase amount of physical activity.;Long Term: Exercising regularly at least 3-5 days a week.       Increase Strength and Stamina Yes       Intervention Provide advice, education, support and counseling about physical activity/exercise needs.;Develop an individualized exercise prescription for aerobic and resistive training based on initial evaluation findings, risk stratification, comorbidities and participant's personal goals.       Expected Outcomes Long Term: Improve cardiorespiratory fitness, muscular endurance and strength as measured by increased METs and functional capacity ( );Short Term: Increase workloads from initial exercise prescription for resistance, speed, and METs.;Short Term: Perform resistance training exercises routinely during rehab and add in resistance training at home       Able to understand and use rate of perceived exertion (RPE) scale Yes       Intervention Provide education and explanation on how to use RPE scale       Expected Outcomes Short Term: Able to use RPE daily in rehab to express subjective intensity level;Long Term:  Able to use RPE to guide intensity level when exercising independently       Able to understand and use Dyspnea scale Yes       Intervention Provide education and explanation on how to use Dyspnea scale       Expected Outcomes Short Term: Able to use Dyspnea scale daily in rehab to express subjective sense of shortness of breath during exertion;Long Term: Able to use Dyspnea scale to guide intensity level when exercising independently       Knowledge and understanding of Target Heart Rate Range (THRR) Yes       Intervention Provide education and explanation of THRR including how the numbers were predicted and where they are located for reference       Expected Outcomes Short Term: Able to state/look up THRR;Long Term: Able to use THRR to govern intensity when exercising independently;Short Term: Able to use daily as guideline  for intensity in rehab       Able to check pulse independently Yes       Intervention Review the importance of being able to check your own pulse for safety during independent exercise;Provide education and demonstration on how to check pulse in carotid and radial arteries.       Expected Outcomes Short Term: Able to explain why pulse checking is important during independent exercise;Long Term: Able  to check pulse independently and accurately       Understanding of Exercise Prescription Yes       Intervention Provide education, explanation, and written materials on patient's individual exercise prescription       Expected Outcomes Short Term: Able to explain program exercise prescription;Long Term: Able to explain home exercise prescription to exercise independently          Exercise Goals Re-Evaluation :  Exercise Goals Re-Evaluation     Row Name 11/22/23 0735 12/08/23 0936           Exercise Goal Re-Evaluation   Exercise Goals Review Increase Physical Activity;Able to understand and use rate of perceived exertion (RPE) scale;Knowledge and understanding of Target Heart Rate Range (THRR);Understanding of Exercise Prescription;Increase Strength and Stamina;Able to understand and use Dyspnea scale;Able to check pulse independently Increase Physical Activity;Increase Strength and Stamina;Understanding of Exercise Prescription      Comments Reviewed RPE and dyspnea scale, THR and program prescription with pt today.  Pt voiced understanding and was given a copy of goals to take home. Randy Ingram is off to a good start in the program, and was able to attend his first few sessions during this review period. During these sessions he was able to increase his workload on the treadmill to a speed of 3. and an incline of 2.5%. He was also able to increase from level 3 to 10 on the XR. We will continue to monitor his progress in the program.      Expected Outcomes Short: Use RPE daily to regulate intensity.  Long: Follow program prescription in THR. Short: Continue to follow exercise prescription. Long: Continue exercise to improve strength and stamina.         Discharge Exercise Prescription (Final Exercise Prescription Changes):  Exercise Prescription Changes - 12/08/23 0900       Response to Exercise   Blood Pressure (Admit) 118/62    Blood Pressure (Exercise) 160/82    Blood Pressure (Exit) 102/62    Heart Rate (Admit) 60 bpm    Heart Rate (Exercise) 140 bpm    Heart Rate (Exit) 75 bpm    Rating of Perceived Exertion (Exercise) 14    Perceived Dyspnea (Exercise) 0    Symptoms none    Comments first 2 weeks of exercise    Duration Progress to 30 minutes of  aerobic without signs/symptoms of physical distress    Intensity THRR unchanged      Progression   Progression Continue to progress workloads to maintain intensity without signs/symptoms of physical distress.    Average METs 4.72      Resistance Training   Training Prescription Yes    Weight 7 lb    Reps 10-15      Interval Training   Interval Training No      Treadmill   MPH 3.1    Grade 2.5    Minutes 15    METs 4.44      REL-XR   Level 10    Minutes 15    METs 7.8      Rower   Level 10    Watts 60    Minutes 15    METs 4.8      Oxygen   Maintain Oxygen Saturation 88% or higher          Nutrition:  Target Goals: Understanding of nutrition guidelines, daily intake of sodium 1500mg , cholesterol 200mg , calories 30% from fat and 7% or less from saturated fats, daily to have  5 or more servings of fruits and vegetables.  Education: Nutrition 1 -Group instruction provided by verbal, written material, interactive activities, discussions, models, and posters to present general guidelines for heart healthy nutrition including macronutrients, label reading, and promoting whole foods over processed counterparts. Education serves as Pensions consultant of discussion of heart healthy eating for all. Written material  provided at class time.    Education: Nutrition 2 -Group instruction provided by verbal, written material, interactive activities, discussions, models, and posters to present general guidelines for heart healthy nutrition including sodium, cholesterol, and saturated fat. Providing guidance of habit forming to improve blood pressure, cholesterol, and body weight. Written material provided at class time.     Biometrics:  Pre Biometrics - 11/17/23 0918       Pre Biometrics   Height 5' 9.5 (1.765 m)    Weight 212 lb 1.6 oz (96.2 kg)    Waist Circumference 42 inches    Hip Circumference 40 inches    Waist to Hip Ratio 1.05 %    BMI (Calculated) 30.88    Single Leg Stand 30 seconds           Nutrition Therapy Plan and Nutrition Goals:  Nutrition Therapy & Goals - 11/17/23 0913       Nutrition Therapy   RD appointment deferred Yes      Intervention Plan   Intervention Prescribe, educate and counsel regarding individualized specific dietary modifications aiming towards targeted core components such as weight, hypertension, lipid management, diabetes, heart failure and other comorbidities.    Expected Outcomes Short Term Goal: Understand basic principles of dietary content, such as calories, fat, sodium, cholesterol and nutrients.;Short Term Goal: A plan has been developed with personal nutrition goals set during dietitian appointment.;Long Term Goal: Adherence to prescribed nutrition plan.          Nutrition Assessments:  MEDIFICTS Score Key: >=70 Need to make dietary changes  40-70 Heart Healthy Diet <= 40 Therapeutic Level Cholesterol Diet  Flowsheet Row Cardiac Rehab from 11/17/2023 in [facility] Cardiac and Pulmonary Rehab  Picture Your Plate Total Score on Admission 67   Picture Your Plate Scores: <59 Unhealthy dietary pattern with much room for improvement. 41-50 Dietary pattern unlikely to meet recommendations for good health and room for improvement. 51-60 More  healthful dietary pattern, with some room for improvement.  >60 Healthy dietary pattern, although there may be some specific behaviors that could be improved.    Nutrition Goals Re-Evaluation:   Nutrition Goals Discharge (Final Nutrition Goals Re-Evaluation):   Psychosocial: Target Goals: Acknowledge presence or absence of significant depression and/or stress, maximize coping skills, provide positive support system. Participant is able to verbalize types and ability to use techniques and skills needed for reducing stress and depression.   Education: Stress, Anxiety, and Depression - Group verbal and visual presentation to define topics covered.  Reviews how body is impacted by stress, anxiety, and depression.  Also discusses healthy ways to reduce stress and to treat/manage anxiety and depression. Written material provided at class time.   Education: Sleep Hygiene -Provides group verbal and written instruction about how sleep can affect your health.  Define sleep hygiene, discuss sleep cycles and impact of sleep habits. Review good sleep hygiene tips.   Initial Review & Psychosocial Screening:  Initial Psych Review & Screening - 11/11/23 1412       Initial Review   Current issues with None Identified      Family Dynamics   Good Support System? Yes  family     Barriers   Psychosocial barriers to participate in program There are no identifiable barriers or psychosocial needs.;The patient should benefit from training in stress management and relaxation.      Screening Interventions   Interventions To provide support and resources with identified psychosocial needs;Encouraged to exercise;Provide feedback about the scores to participant    Expected Outcomes Short Term goal: Utilizing psychosocial counselor, staff and physician to assist with identification of specific Stressors or current issues interfering with healing process. Setting desired goal for each stressor or current issue  identified.;Long Term Goal: Stressors or current issues are controlled or eliminated.;Short Term goal: Identification and review with participant of any Quality of Life or Depression concerns found by scoring the questionnaire.;Long Term goal: The participant improves quality of Life and PHQ9 Scores as seen by post scores and/or verbalization of changes          Quality of Life Scores:   Quality of Life - 11/17/23 0911       Quality of Life   Select Quality of Life      Quality of Life Scores   Health/Function Pre 27.6 %    Socioeconomic Pre 27.38 %    Psych/Spiritual Pre 28.29 %    Family Pre 30 %    GLOBAL Pre 28.03 %         Scores of 19 and below usually indicate a poorer quality of life in these areas.  A difference of  2-3 points is a clinically meaningful difference.  A difference of 2-3 points in the total score of the Quality of Life Index has been associated with significant improvement in overall quality of life, self-image, physical symptoms, and general health in studies assessing change in quality of life.  PHQ-9: Review Flowsheet  More data exists      11/17/2023 08/18/2020 01/01/2020 11/27/2018 11/08/2017  Depression screen PHQ 2/9  Decreased Interest 0 0 0 0 0 0  Down, Depressed, Hopeless 0 0 0 0 0  PHQ - 2 Score 0 0 0 0 0 0  Altered sleeping 0 0 0 - -  Tired, decreased energy 0 0 0 - -  Change in appetite 0 0 0 - -  Feeling bad or failure about yourself  0 0 0 - -  Trouble concentrating 0 0 0 - -  Moving slowly or fidgety/restless 0 0 0 - -  Suicidal thoughts 0 0 0 - -  PHQ-9 Score 0 0 0 - -  Difficult doing work/chores - Not difficult at all Not difficult at all - -    Details       Multiple values from one day are sorted in reverse-chronological order        Interpretation of Total Score  Total Score Depression Severity:  1-4 = Minimal depression, 5-9 = Mild depression, 10-14 = Moderate depression, 15-19 = Moderately severe depression, 20-27 =  Severe depression   Psychosocial Evaluation and Intervention:  Psychosocial Evaluation - 11/11/23 1417       Psychosocial Evaluation & Interventions   Interventions Encouraged to exercise with the program and follow exercise prescription    Comments Randy Ingram is coming to cardiac rehab post stents. He states he has been feeling well and already back to work. He states he has no stress concerns at this time and sleeps very well. He is looking forward to attending the program to learn more about heart healthy living    Expected Outcomes Short: attend cardiac rehab  for education and exercise Long: develop and maintain positive self care habits    Continue Psychosocial Services  Follow up required by staff          Psychosocial Re-Evaluation:   Psychosocial Discharge (Final Psychosocial Re-Evaluation):   Vocational Rehabilitation: Provide vocational rehab assistance to qualifying candidates.   Vocational Rehab Evaluation & Intervention:   Education: Education Goals: Education classes will be provided on a variety of topics geared toward better understanding of heart health and risk factor modification. Participant will state understanding/return demonstration of topics presented as noted by education test scores.  Learning Barriers/Preferences:  Learning Barriers/Preferences - 11/11/23 1407       Learning Barriers/Preferences   Learning Barriers None    Learning Preferences None          General Cardiac Education Topics:  AED/CPR: - Group verbal and written instruction with the use of models to demonstrate the basic use of the AED with the basic ABC's of resuscitation.   Test and Procedures: - Group verbal and visual presentation and models provide information about basic cardiac anatomy and function. Reviews the testing methods done to diagnose heart disease and the outcomes of the test results. Describes the treatment choices: Medical Management, Angioplasty, or  Coronary Bypass Surgery for treating various heart conditions including Myocardial Infarction, Angina, Valve Disease, and Cardiac Arrhythmias. Written material provided at class time.   Medication Safety: - Group verbal and visual instruction to review commonly prescribed medications for heart and lung disease. Reviews the medication, class of the drug, and side effects. Includes the steps to properly store meds and maintain the prescription regimen. Written material provided at class time.   Intimacy: - Group verbal instruction through game format to discuss how heart and lung disease can affect sexual intimacy. Written material provided at class time.   Know Your Numbers and Heart Failure: - Group verbal and visual instruction to discuss disease risk factors for cardiac and pulmonary disease and treatment options.  Reviews associated critical values for Overweight/Obesity, Hypertension, Cholesterol, and Diabetes.  Discusses basics of heart failure: signs/symptoms and treatments.  Introduces Heart Failure Zone chart for action plan for heart failure. Written material provided at class time.   Infection Prevention: - Provides verbal and written material to individual with discussion of infection control including proper hand washing and proper equipment cleaning during exercise session. Flowsheet Row Cardiac Rehab from 11/17/2023 in [facility] Cardiac and Pulmonary Rehab  Date 11/17/23  Educator NT  Instruction Review Code 1- Verbalizes Understanding    Falls Prevention: - Provides verbal and written material to individual with discussion of falls prevention and safety. Flowsheet Row Cardiac Rehab from 11/17/2023 in [facility] Cardiac and Pulmonary Rehab  Date 11/17/23  Educator NT  Instruction Review Code 1- Verbalizes Understanding    Other: -Provides group and verbal instruction on various topics (see comments)   Knowledge Questionnaire Score:  Knowledge Questionnaire Score - 11/17/23 0911        Knowledge Questionnaire Score   Pre Score 23/26          Core Components/Risk Factors/Patient Goals at Admission:  Personal Goals and Risk Factors at Admission - 11/11/23 1405       Core Components/Risk Factors/Patient Goals on Admission    Weight Management Yes;Weight Loss    Intervention Weight Management: Develop a combined nutrition and exercise program designed to reach desired caloric intake, while maintaining appropriate intake of nutrient and fiber, sodium and fats, and appropriate energy expenditure required for the weight goal.;Weight  Management: Provide education and appropriate resources to help participant work on and attain dietary goals.;Weight Management/Obesity: Establish reasonable short term and long term weight goals.    Expected Outcomes Short Term: Continue to assess and modify interventions until short term weight is achieved;Long Term: Adherence to nutrition and physical activity/exercise program aimed toward attainment of established weight goal;Weight Loss: Understanding of general recommendations for a balanced deficit meal plan, which promotes 1-2 lb weight loss per week and includes a negative energy balance of [id number] kcal/d;Understanding recommendations for meals to include 15-35% energy as protein, 25-35% energy from fat, 35-60% energy from carbohydrates, less than 200mg  of dietary cholesterol, 20-35 gm of total fiber daily;Understanding of distribution of calorie intake throughout the day with the consumption of 4-5 meals/snacks    Hypertension Yes    Intervention Provide education on lifestyle modifcations including regular physical activity/exercise, weight management, moderate sodium restriction and increased consumption of fresh fruit, vegetables, and low fat dairy, alcohol moderation, and smoking cessation.;Monitor prescription use compliance.    Expected Outcomes Short Term: Continued assessment and intervention until BP is < 140/28mm HG in  hypertensive participants. < 130/58mm HG in hypertensive participants with diabetes, heart failure or chronic kidney disease.;Long Term: Maintenance of blood pressure at goal levels.    Lipids Yes    Intervention Provide education and support for participant on nutrition & aerobic/resistive exercise along with prescribed medications to achieve LDL 70mg , HDL >40mg .    Expected Outcomes Short Term: Participant states understanding of desired cholesterol values and is compliant with medications prescribed. Participant is following exercise prescription and nutrition guidelines.;Long Term: Cholesterol controlled with medications as prescribed, with individualized exercise RX and with personalized nutrition plan. Value goals: LDL < 70mg , HDL > 40 mg.          Education:Diabetes - Individual verbal and written instruction to review signs/symptoms of diabetes, desired ranges of glucose level fasting, after meals and with exercise. Acknowledge that pre and post exercise glucose checks will be done for 3 sessions at entry of program.   Core Components/Risk Factors/Patient Goals Review:    Core Components/Risk Factors/Patient Goals at Discharge (Final Review):    ITP Comments:  ITP Comments     Row Name 11/11/23 1420 11/17/23 0908 11/22/23 0735 12/14/23 0849     ITP Comments Initial phone call completed. Diagnosis can be found in [facility] 6/23. EP Orientation scheduled for Thursday 7/31 at 8am. Completed and gym orientation for cardiac rehab. Initial ITP created and sent for review to Dr. Oneil Pinal, Medical Director. First full day of exercise!  Patient was oriented to gym and equipment including functions, settings, policies, and procedures.  Patient's individual exercise prescription and treatment plan were reviewed.  All starting workloads were established based on the results of the 6 minute walk test done at initial orientation visit.  The plan for exercise progression was also introduced and  progression will be customized based on patient's performance and goals. 30 Day review completed. Medical Director ITP review done; changes made as directed and signed approval by Medical Director. New to program.       Comments: 30 day review

## 2023-12-16 DIAGNOSIS — Z955 Presence of coronary angioplasty implant and graft: Secondary | ICD-10-CM | POA: Diagnosis not present

## 2023-12-16 NOTE — Progress Notes (Signed)
 Daily Session Note  Patient Details  Name: Randy Ingram MRN: [id number] Date of Birth: 02-18-1956 Referring Provider:   Flowsheet Row Cardiac Rehab from 11/17/2023 in [facility] Cardiac and Pulmonary Rehab  Referring Provider Dr. Murray Khan, MD    Encounter Date: 12/16/2023  Check In:  Session Check In - 12/16/23 0734       Check-In   Supervising physician immediately available to respond to emergencies See telemetry face sheet for immediately available ER MD    Location [facility]-Cardiac & Pulmonary Rehab    Staff Present Burnard Davenport RN,BSN,MPA;Joseph Hood RCP,RRT,BSRT;Maxon Conetta BS, Exercise Physiologist;Noah Tickle, BS, Exercise Physiologist    Virtual Visit No    Medication changes reported     No    Fall or balance concerns reported    No    Warm-up and Cool-down Performed on first and last piece of equipment    Resistance Training Performed Yes    VAD Patient? No    PAD/SET Patient? No      Pain Assessment   Currently in Pain? No/denies             Social History   Tobacco Use  Smoking Status Never  Smokeless Tobacco Never    Goals Met:  Independence with exercise equipment Exercise tolerated well No report of concerns or symptoms today Strength training completed today  Goals Unmet:  Not Applicable  Comments: Pt able to follow exercise prescription today without complaint.  Will continue to monitor for progression.    Dr. Oneil Pinal is Medical Director for [facility] Cardiac Rehabilitation.  Dr. Fuad Aleskerov is Medical Director for [facility] Pulmonary Rehabilitation.

## 2023-12-20 ENCOUNTER — Encounter

## 2023-12-22 ENCOUNTER — Encounter

## 2023-12-23 ENCOUNTER — Encounter

## 2023-12-27 ENCOUNTER — Encounter: Attending: Internal Medicine

## 2023-12-27 DIAGNOSIS — Z955 Presence of coronary angioplasty implant and graft: Secondary | ICD-10-CM | POA: Insufficient documentation

## 2023-12-29 ENCOUNTER — Encounter

## 2023-12-30 ENCOUNTER — Encounter

## 2024-01-03 ENCOUNTER — Encounter

## 2024-01-03 DIAGNOSIS — Z955 Presence of coronary angioplasty implant and graft: Secondary | ICD-10-CM | POA: Diagnosis present

## 2024-01-03 NOTE — Progress Notes (Signed)
 Daily Session Note  Patient Details  Name: Randy Ingram MRN: [id number] Date of Birth: 06/02/1955 Referring Provider:   Flowsheet Row Cardiac Rehab from 11/17/2023 in [facility] Cardiac and Pulmonary Rehab  Referring Provider Dr. Murray Khan, MD    Encounter Date: 01/03/2024  Check In:  Session Check In - 01/03/24 0733       Check-In   Supervising physician immediately available to respond to emergencies See telemetry face sheet for immediately available ER MD    Location [facility]-Cardiac & Pulmonary Rehab    Staff Present Burnard Davenport [facility], MS, Exercise Physiologist;Laureen Delores, BS, RRT, CPFT    Virtual Visit No    Medication changes reported     No    Fall or balance concerns reported    No    Warm-up and Cool-down Performed on first and last piece of equipment    Resistance Training Performed Yes    VAD Patient? No    PAD/SET Patient? No      Pain Assessment   Currently in Pain? No/denies             Social History   Tobacco Use  Smoking Status Never  Smokeless Tobacco Never    Goals Met:  Independence with exercise equipment Exercise tolerated well No report of concerns or symptoms today Strength training completed today  Goals Unmet:  Not Applicable  Comments: Pt able to follow exercise prescription today without complaint.  Will continue to monitor for progression.    Dr. Oneil Pinal is Medical Director for [facility] Cardiac Rehabilitation.  Dr. Fuad Aleskerov is Medical Director for [facility] Pulmonary Rehabilitation.

## 2024-01-05 ENCOUNTER — Encounter: Admitting: Emergency Medicine

## 2024-01-05 DIAGNOSIS — Z955 Presence of coronary angioplasty implant and graft: Secondary | ICD-10-CM | POA: Diagnosis not present

## 2024-01-05 NOTE — Progress Notes (Signed)
 Daily Session Note  Patient Details  Name: Randy Ingram MRN: [id number] Date of Birth: May 30, 1955 Referring Provider:   Flowsheet Row Cardiac Rehab from 11/17/2023 in [facility] Cardiac and Pulmonary Rehab  Referring Provider Dr. Murray Khan, MD    Encounter Date: 01/05/2024  Check In:  Session Check In - 01/05/24 0737       Check-In   Supervising physician immediately available to respond to emergencies See telemetry face sheet for immediately available ER MD    Location [facility]-Cardiac & Pulmonary Rehab    Staff Present Fairy Plater RCP,RRT,BSRT;Jaysean Manville RN,BSN;Jason Elnor [facility]    Virtual Visit No    Medication changes reported     No    Fall or balance concerns reported    No    Warm-up and Cool-down Performed on first and last piece of equipment    Resistance Training Performed Yes    VAD Patient? No    PAD/SET Patient? No      Pain Assessment   Currently in Pain? No/denies             Social History   Tobacco Use  Smoking Status Never  Smokeless Tobacco Never    Goals Met:  Independence with exercise equipment Exercise tolerated well No report of concerns or symptoms today Strength training completed today  Goals Unmet:  Not Applicable  Comments: Pt able to follow exercise prescription today without complaint.  Will continue to monitor for progression.    Dr. Oneil Pinal is Medical Director for [facility] Cardiac Rehabilitation.  Dr. Fuad Aleskerov is Medical Director for [facility] Pulmonary Rehabilitation.

## 2024-01-06 ENCOUNTER — Encounter

## 2024-01-06 DIAGNOSIS — Z955 Presence of coronary angioplasty implant and graft: Secondary | ICD-10-CM

## 2024-01-06 NOTE — Progress Notes (Signed)
 Daily Session Note  Patient Details  Name: Randy Ingram MRN: [id number] Date of Birth: 1955/10/11 Referring Provider:   Flowsheet Row Cardiac Rehab from 11/17/2023 in [facility] Cardiac and Pulmonary Rehab  Referring Provider Dr. Murray Khan, MD    Encounter Date: 01/06/2024  Check In:  Session Check In - 01/06/24 0800       Check-In   Supervising physician immediately available to respond to emergencies See telemetry face sheet for immediately available ER MD    Location [facility]-Cardiac & Pulmonary Rehab    Staff Present Devaughn Jaeger, BS, Exercise Physiologist;Joseph Rolinda RCP,RRT,BSRT;Marijane Trower RN,BSN    Virtual Visit No    Medication changes reported     No    Fall or balance concerns reported    No    Warm-up and Cool-down Performed on first and last piece of equipment    Resistance Training Performed Yes    VAD Patient? No    PAD/SET Patient? No      Pain Assessment   Currently in Pain? No/denies             Social History   Tobacco Use  Smoking Status Never  Smokeless Tobacco Never    Goals Met:  Independence with exercise equipment Exercise tolerated well No report of concerns or symptoms today Strength training completed today  Goals Unmet:  Not Applicable  Comments: Pt able to follow exercise prescription today without complaint.  Will continue to monitor for progression.    Dr. Oneil Pinal is Medical Director for [facility] Cardiac Rehabilitation.  Dr. Fuad Aleskerov is Medical Director for [facility] Pulmonary Rehabilitation.

## 2024-01-10 ENCOUNTER — Encounter

## 2024-01-11 ENCOUNTER — Encounter: Payer: Self-pay | Admitting: *Deleted

## 2024-01-11 DIAGNOSIS — Z955 Presence of coronary angioplasty implant and graft: Secondary | ICD-10-CM

## 2024-01-11 NOTE — Progress Notes (Signed)
 Cardiac Individual Treatment Plan  Patient Details  Name: Randy Ingram MRN: [id number] Date of Birth: 12-08-1955 Referring Provider:   Flowsheet Row Cardiac Rehab from 11/17/2023 in [facility] Cardiac and Pulmonary Rehab  Referring Provider Dr. Murray Khan, MD    Initial Encounter Date:  Flowsheet Row Cardiac Rehab from 11/17/2023 in [facility] Cardiac and Pulmonary Rehab  Date 11/17/23    Visit Diagnosis: Status post coronary artery stent placement  Patient's Home Medications on Admission:  Current Outpatient Medications:    amLODipine  (NORVASC ) 10 MG tablet, Take 1 tablet (10 mg total) by mouth daily., Disp: 90 tablet, Rfl: 3   aspirin  EC 81 MG tablet, Take 81 mg by mouth in the morning. Swallow whole., Disp: , Rfl:    B COMPLEX VITAMINS PO, Take 1 tablet by mouth in the morning., Disp: , Rfl:    chlorthalidone  (HYGROTON ) 50 MG tablet, Take 50 mg by mouth in the morning., Disp: , Rfl:    cholecalciferol (VITAMIN D3) 25 MCG (1000 UNIT) tablet, Take 1,000 Units by mouth in the morning., Disp: , Rfl:    clopidogrel  (PLAVIX ) 75 MG tablet, Take 1 tablet (75 mg total) by mouth daily., Disp: 60 tablet, Rfl: 0   clopidogrel  (PLAVIX ) 75 MG tablet, Take 1 tablet (75 mg total) by mouth daily., Disp: 60 tablet, Rfl: 0   isosorbide  mononitrate (IMDUR ) 30 MG 24 hr tablet, Take 1 tablet (30 mg total) by mouth daily., Disp: 30 tablet, Rfl: 0   isosorbide  mononitrate (IMDUR ) 60 MG 24 hr tablet, Take 60 mg by mouth daily., Disp: , Rfl:    Krill Oil 1000 MG CAPS, Take 1,000 mg by mouth in the morning., Disp: , Rfl:    losartan  (COZAAR ) 100 MG tablet, Take 1 tablet (100 mg total) by mouth daily., Disp: 90 tablet, Rfl: 0   metoprolol succinate (TOPROL-XL) 25 MG 24 hr tablet, Take 25 mg by mouth in the morning., Disp: , Rfl:    OVER THE COUNTER MEDICATION, Take 1 Scoop by mouth in the morning. Oligomeric proanthocyanidins (OPC), Disp: , Rfl:    Potassium 99 MG TABS, Take 99 mg by mouth in the morning., Disp: , Rfl:     Resveratrol 100 MG CAPS, Take 100 mg by mouth in the morning., Disp: , Rfl:    rosuvastatin  (CRESTOR ) 40 MG tablet, Take 40 mg by mouth every evening., Disp: , Rfl:   Past Medical History: Past Medical History:  Diagnosis Date   Hypertension    controlled on meds    Tobacco Use: Social History   Tobacco Use  Smoking Status Never  Smokeless Tobacco Never    Labs: Review Flowsheet  More data exists      Latest Ref Rng & Units 10/28/2016 11/09/2017 11/29/2018 01/01/2020 02/20/2021  Labs for ITP Cardiac and Pulmonary Rehab  Cholestrol 100 - 199 mg/dL 803  799  801  802  814   LDL (calc) 0 - 99 mg/dL 878  869  871  880  887   HDL-C >39 mg/dL 46  47  45  46  46   Trlycerides 0 - 149 mg/dL 856  883  876  818  845   Hemoglobin A1c 4.8 - 5.6 % - - - - 5.5      Exercise Target Goals: Exercise Program Goal: Individual exercise prescription set using results from initial 6 min walk test and THRR while considering  patient's activity barriers and safety.   Exercise Prescription Goal: Initial exercise prescription builds to 30-45  minutes a day of aerobic activity, 2-3 days per week.  Home exercise guidelines will be given to patient during program as part of exercise prescription that the participant will acknowledge.   Education: Aerobic Exercise: - Group verbal and visual presentation on the components of exercise prescription. Introduces F.I.T.T principle from ACSM for exercise prescriptions.  Reviews F.I.T.T. principles of aerobic exercise including progression. Written material provided at class time. Flowsheet Row Cardiac Rehab from 11/17/2023 in [facility] Cardiac and Pulmonary Rehab  Education need identified 11/17/23    Education: Resistance Exercise: - Group verbal and visual presentation on the components of exercise prescription. Introduces F.I.T.T principle from ACSM for exercise prescriptions  Reviews F.I.T.T. principles of resistance exercise including progression. Written  material provided at class time.    Education: Exercise & Equipment Safety: - Individual verbal instruction and demonstration of equipment use and safety with use of the equipment. Flowsheet Row Cardiac Rehab from 11/17/2023 in [facility] Cardiac and Pulmonary Rehab  Date 11/17/23  Educator NT  Instruction Review Code 1- Verbalizes Understanding    Education: Exercise Physiology & General Exercise Guidelines: - Group verbal and written instruction with models to review the exercise physiology of the cardiovascular system and associated critical values. Provides general exercise guidelines with specific guidelines to those with heart or lung disease. Written material provided at class time.   Education: Flexibility, Balance, Mind/Body Relaxation: - Group verbal and visual presentation with interactive activity on the components of exercise prescription. Introduces F.I.T.T principle from ACSM for exercise prescriptions. Reviews F.I.T.T. principles of flexibility and balance exercise training including progression. Also discusses the mind body connection.  Reviews various relaxation techniques to help reduce and manage stress (i.e. Deep breathing, progressive muscle relaxation, and visualization). Balance handout provided to take home. Written material provided at class time.   Activity Barriers & Risk Stratification:  Activity Barriers & Cardiac Risk Stratification - 11/11/23 1402       Activity Barriers & Cardiac Risk Stratification   Activity Barriers Other (comment)    Comments left foot plantar fascitis    Cardiac Risk Stratification Moderate          6 Minute Walk:  6 Minute Walk     Row Name 11/17/23 0916         6 Minute Walk   Phase Initial     Distance 1500 feet     Walk Time 6 minutes     # of Rest Breaks 0     MPH 2.84     METS 3.37     RPE 11     Perceived Dyspnea  0     VO2 Peak 11.8     Symptoms No     Resting HR 59 bpm     Resting BP 126/74     Resting Oxygen  Saturation  96 %     Exercise Oxygen Saturation  during 6 min walk 94 %     Max Ex. HR 98 bpm     Max Ex. BP 144/72     2 Minute Post BP 128/70        Oxygen Initial Assessment:   Oxygen Re-Evaluation:   Oxygen Discharge (Final Oxygen Re-Evaluation):   Initial Exercise Prescription:  Initial Exercise Prescription - 11/17/23 0900       Date of Initial Exercise RX and Referring Provider   Date 11/17/23    Referring Provider Dr. Murray Khan, MD      Oxygen   Maintain Oxygen Saturation 88% or higher  Treadmill   MPH 2.8    Grade 1    Minutes 15    METs 3.53      REL-XR   Level 3    Speed 50    Minutes 15    METs 3.37      Rower   Level 4    Watts 35    Minutes 15    METs 3.37      Prescription Details   Frequency (times per week) 3    Duration Progress to 30 minutes of continuous aerobic without signs/symptoms of physical distress      Intensity   THRR 40-80% of Max Heartrate 97-135    Ratings of Perceived Exertion 11-13    Perceived Dyspnea 0-4      Progression   Progression Continue to progress workloads to maintain intensity without signs/symptoms of physical distress.      Resistance Training   Training Prescription Yes    Weight 7 lb    Reps 10-15          Perform Capillary Blood Glucose checks as needed.  Exercise Prescription Changes:   Exercise Prescription Changes     Row Name 11/17/23 0900 12/08/23 0900 12/22/23 1600         Response to Exercise   Blood Pressure (Admit) 126/74 118/62 126/62     Blood Pressure (Exercise) 144/72 160/82 168/82     Blood Pressure (Exit) 128/70 102/62 114/62     Heart Rate (Admit) 59 bpm 60 bpm 66 bpm     Heart Rate (Exercise) 98 bpm 140 bpm 131 bpm     Heart Rate (Exit) 69 bpm 75 bpm 80 bpm     Oxygen Saturation (Admit) 96 % -- --     Oxygen Saturation (Exercise) 94 % -- --     Rating of Perceived Exertion (Exercise) 11 14 14      Perceived Dyspnea (Exercise) 0 0 0     Symptoms none none none      Comments Results first 2 weeks of exercise --     Duration -- Progress to 30 minutes of  aerobic without signs/symptoms of physical distress Progress to 30 minutes of  aerobic without signs/symptoms of physical distress     Intensity -- THRR unchanged THRR unchanged       Progression   Progression -- Continue to progress workloads to maintain intensity without signs/symptoms of physical distress. Continue to progress workloads to maintain intensity without signs/symptoms of physical distress.     Average METs -- 4.72 5.46       Resistance Training   Training Prescription -- Yes Yes     Weight -- 7 lb 7 lb     Reps -- 10-15 10-15       Interval Training   Interval Training -- No No       Treadmill   MPH -- 3.1 3.2     Grade -- 2.5 3     Minutes -- 15 15     METs -- 4.44 4.77       REL-XR   Level -- 10 8     Minutes -- 15 15     METs -- 7.8 7.5       Rower   Level -- 10 10     Watts -- 60 56     Minutes -- 15 15     METs -- 4.8 4.81       Oxygen   Maintain Oxygen Saturation --  88% or higher 88% or higher        Exercise Comments:   Exercise Comments     Row Name 11/22/23 [id number]           Exercise Comments First full day of exercise!  Patient was oriented to gym and equipment including functions, settings, policies, and procedures.  Patient's individual exercise prescription and treatment plan were reviewed.  All starting workloads were established based on the results of the 6 minute walk test done at initial orientation visit.  The plan for exercise progression was also introduced and progression will be customized based on patient's performance and goals.          Exercise Goals and Review:   Exercise Goals     Row Name 11/17/23 0917             Exercise Goals   Increase Physical Activity Yes       Intervention Provide advice, education, support and counseling about physical activity/exercise needs.;Develop an individualized exercise prescription  for aerobic and resistive training based on initial evaluation findings, risk stratification, comorbidities and participant's personal goals.       Expected Outcomes Short Term: Attend rehab on a regular basis to increase amount of physical activity.;Long Term: Add in home exercise to make exercise part of routine and to increase amount of physical activity.;Long Term: Exercising regularly at least 3-5 days a week.       Increase Strength and Stamina Yes       Intervention Provide advice, education, support and counseling about physical activity/exercise needs.;Develop an individualized exercise prescription for aerobic and resistive training based on initial evaluation findings, risk stratification, comorbidities and participant's personal goals.       Expected Outcomes Long Term: Improve cardiorespiratory fitness, muscular endurance and strength as measured by increased METs and functional capacity ( );Short Term: Increase workloads from initial exercise prescription for resistance, speed, and METs.;Short Term: Perform resistance training exercises routinely during rehab and add in resistance training at home       Able to understand and use rate of perceived exertion (RPE) scale Yes       Intervention Provide education and explanation on how to use RPE scale       Expected Outcomes Short Term: Able to use RPE daily in rehab to express subjective intensity level;Long Term:  Able to use RPE to guide intensity level when exercising independently       Able to understand and use Dyspnea scale Yes       Intervention Provide education and explanation on how to use Dyspnea scale       Expected Outcomes Short Term: Able to use Dyspnea scale daily in rehab to express subjective sense of shortness of breath during exertion;Long Term: Able to use Dyspnea scale to guide intensity level when exercising independently       Knowledge and understanding of Target Heart Rate Range (THRR) Yes       Intervention  Provide education and explanation of THRR including how the numbers were predicted and where they are located for reference       Expected Outcomes Short Term: Able to state/look up THRR;Long Term: Able to use THRR to govern intensity when exercising independently;Short Term: Able to use daily as guideline for intensity in rehab       Able to check pulse independently Yes       Intervention Review the importance of being able to check your own pulse for safety during independent  exercise;Provide education and demonstration on how to check pulse in carotid and radial arteries.       Expected Outcomes Short Term: Able to explain why pulse checking is important during independent exercise;Long Term: Able to check pulse independently and accurately       Understanding of Exercise Prescription Yes       Intervention Provide education, explanation, and written materials on patient's individual exercise prescription       Expected Outcomes Short Term: Able to explain program exercise prescription;Long Term: Able to explain home exercise prescription to exercise independently          Exercise Goals Re-Evaluation :  Exercise Goals Re-Evaluation     Row Name 11/22/23 0735 12/08/23 0936 12/22/23 1617 01/05/24 0800 01/05/24 1403     Exercise Goal Re-Evaluation   Exercise Goals Review Increase Physical Activity;Able to understand and use rate of perceived exertion (RPE) scale;Knowledge and understanding of Target Heart Rate Range (THRR);Understanding of Exercise Prescription;Increase Strength and Stamina;Able to understand and use Dyspnea scale;Able to check pulse independently Increase Physical Activity;Increase Strength and Stamina;Understanding of Exercise Prescription Increase Physical Activity;Increase Strength and Stamina;Understanding of Exercise Prescription Increase Physical Activity;Increase Strength and Stamina;Understanding of Exercise Prescription Increase Physical Activity;Increase Strength and  Stamina;Understanding of Exercise Prescription   Comments Reviewed RPE and dyspnea scale, THR and program prescription with pt today.  Pt voiced understanding and was given a copy of goals to take home. Randy Ingram is off to a good start in the program, and was able to attend his first few sessions during this review period. During these sessions he was able to increase his workload on the treadmill to a speed of 3. and an incline of 2.5%. He was also able to increase from level 3 to 10 on the XR. We will continue to monitor his progress in the program. Randy Ingram is doing well in rehab. He was recently able to increase his workload on the treadmill to a speed of 3.2mph and 3% incline. He was able to maintain level 10 on the rowing machine. We will continue to monitor his progress in the program. Randy Ingram is doing well at rehab. He says he works hard at home. Not intentional exercise, but is active every day. Encouraged him to add more intentional exercise into his home routine Even did not attend rehab during the last review period. He did return to the program on 01/03/2024. We will continue to monitor his progress in the program.   Expected Outcomes Short: Use RPE daily to regulate intensity. Long: Follow program prescription in THR. Short: Continue to follow exercise prescription. Long: Continue exercise to improve strength and stamina. Short: Continue to increase treadmill workloads. Long: Continue exercise to improve strength and stamina. STg: Look to add more intentional exercise. LTG: Continue exercise to improve strength and stamina. Short: Return to consistent attendance in rehab. Long: Graduate.      Discharge Exercise Prescription (Final Exercise Prescription Changes):  Exercise Prescription Changes - 12/22/23 1600       Response to Exercise   Blood Pressure (Admit) 126/62    Blood Pressure (Exercise) 168/82    Blood Pressure (Exit) 114/62    Heart Rate (Admit) 66 bpm    Heart Rate (Exercise)  131 bpm    Heart Rate (Exit) 80 bpm    Rating of Perceived Exertion (Exercise) 14    Perceived Dyspnea (Exercise) 0    Symptoms none    Duration Progress to 30 minutes of  aerobic without signs/symptoms of physical  distress    Intensity THRR unchanged      Progression   Progression Continue to progress workloads to maintain intensity without signs/symptoms of physical distress.    Average METs 5.46      Resistance Training   Training Prescription Yes    Weight 7 lb    Reps 10-15      Interval Training   Interval Training No      Treadmill   MPH 3.2    Grade 3    Minutes 15    METs 4.77      REL-XR   Level 8    Minutes 15    METs 7.5      Rower   Level 10    Watts 56    Minutes 15    METs 4.81      Oxygen   Maintain Oxygen Saturation 88% or higher          Nutrition:  Target Goals: Understanding of nutrition guidelines, daily intake of sodium 1500mg , cholesterol 200mg , calories 30% from fat and 7% or less from saturated fats, daily to have 5 or more servings of fruits and vegetables.  Education: Nutrition 1 -Group instruction provided by verbal, written material, interactive activities, discussions, models, and posters to present general guidelines for heart healthy nutrition including macronutrients, label reading, and promoting whole foods over processed counterparts. Education serves as Pensions consultant of discussion of heart healthy eating for all. Written material provided at class time.    Education: Nutrition 2 -Group instruction provided by verbal, written material, interactive activities, discussions, models, and posters to present general guidelines for heart healthy nutrition including sodium, cholesterol, and saturated fat. Providing guidance of habit forming to improve blood pressure, cholesterol, and body weight. Written material provided at class time.     Biometrics:  Pre Biometrics - 11/17/23 0918       Pre Biometrics   Height 5' 9.5 (1.765  m)    Weight 212 lb 1.6 oz (96.2 kg)    Waist Circumference 42 inches    Hip Circumference 40 inches    Waist to Hip Ratio 1.05 %    BMI (Calculated) 30.88    Single Leg Stand 30 seconds           Nutrition Therapy Plan and Nutrition Goals:  Nutrition Therapy & Goals - 11/17/23 0913       Nutrition Therapy   RD appointment deferred Yes      Intervention Plan   Intervention Prescribe, educate and counsel regarding individualized specific dietary modifications aiming towards targeted core components such as weight, hypertension, lipid management, diabetes, heart failure and other comorbidities.    Expected Outcomes Short Term Goal: Understand basic principles of dietary content, such as calories, fat, sodium, cholesterol and nutrients.;Short Term Goal: A plan has been developed with personal nutrition goals set during dietitian appointment.;Long Term Goal: Adherence to prescribed nutrition plan.          Nutrition Assessments:  MEDIFICTS Score Key: >=70 Need to make dietary changes  40-70 Heart Healthy Diet <= 40 Therapeutic Level Cholesterol Diet  Flowsheet Row Cardiac Rehab from 11/17/2023 in [facility] Cardiac and Pulmonary Rehab  Picture Your Plate Total Score on Admission 67   Picture Your Plate Scores: <59 Unhealthy dietary pattern with much room for improvement. 41-50 Dietary pattern unlikely to meet recommendations for good health and room for improvement. 51-60 More healthful dietary pattern, with some room for improvement.  >60 Healthy dietary pattern, although there may be some  specific behaviors that could be improved.    Nutrition Goals Re-Evaluation:  Nutrition Goals Re-Evaluation     Row Name 01/05/24 0805             Goals   Comment RD appointment deferred. (Spoke with Smayan about nutrition. He report he is eating eggs and bacon most mornings. He drinks whole milk and eats whole yogurt. Stays away from carbs. Discussed the high saturated fat nature of  bacon and whole dairy products. Educated on the importance of whole grains and not to avoid quality carbs. He says he has heard that before but is choosing to follow the advice of another cardiologist)          Nutrition Goals Discharge (Final Nutrition Goals Re-Evaluation):  Nutrition Goals Re-Evaluation - 01/05/24 0805       Goals   Comment RD appointment deferred. (Spoke with Randy Ingram about nutrition. He report he is eating eggs and bacon most mornings. He drinks whole milk and eats whole yogurt. Stays away from carbs. Discussed the high saturated fat nature of bacon and whole dairy products. Educated on the importance of whole grains and not to avoid quality carbs. He says he has heard that before but is choosing to follow the advice of another cardiologist)          Psychosocial: Target Goals: Acknowledge presence or absence of significant depression and/or stress, maximize coping skills, provide positive support system. Participant is able to verbalize types and ability to use techniques and skills needed for reducing stress and depression.   Education: Stress, Anxiety, and Depression - Group verbal and visual presentation to define topics covered.  Reviews how body is impacted by stress, anxiety, and depression.  Also discusses healthy ways to reduce stress and to treat/manage anxiety and depression. Written material provided at class time.   Education: Sleep Hygiene -Provides group verbal and written instruction about how sleep can affect your health.  Define sleep hygiene, discuss sleep cycles and impact of sleep habits. Review good sleep hygiene tips.   Initial Review & Psychosocial Screening:  Initial Psych Review & Screening - 11/11/23 1412       Initial Review   Current issues with None Identified      Family Dynamics   Good Support System? Yes   family     Barriers   Psychosocial barriers to participate in program There are no identifiable barriers or psychosocial  needs.;The patient should benefit from training in stress management and relaxation.      Screening Interventions   Interventions To provide support and resources with identified psychosocial needs;Encouraged to exercise;Provide feedback about the scores to participant    Expected Outcomes Short Term goal: Utilizing psychosocial counselor, staff and physician to assist with identification of specific Stressors or current issues interfering with healing process. Setting desired goal for each stressor or current issue identified.;Long Term Goal: Stressors or current issues are controlled or eliminated.;Short Term goal: Identification and review with participant of any Quality of Life or Depression concerns found by scoring the questionnaire.;Long Term goal: The participant improves quality of Life and PHQ9 Scores as seen by post scores and/or verbalization of changes          Quality of Life Scores:   Quality of Life - 11/17/23 0911       Quality of Life   Select Quality of Life      Quality of Life Scores   Health/Function Pre 27.6 %    Socioeconomic Pre 27.38 %  Psych/Spiritual Pre 28.29 %    Family Pre 30 %    GLOBAL Pre 28.03 %         Scores of 19 and below usually indicate a poorer quality of life in these areas.  A difference of  2-3 points is a clinically meaningful difference.  A difference of 2-3 points in the total score of the Quality of Life Index has been associated with significant improvement in overall quality of life, self-image, physical symptoms, and general health in studies assessing change in quality of life.  PHQ-9: Review Flowsheet  More data exists      11/17/2023 08/18/2020 01/01/2020 11/27/2018 11/08/2017  Depression screen PHQ 2/9  Decreased Interest 0 0 0 0 0 0  Down, Depressed, Hopeless 0 0 0 0 0  PHQ - 2 Score 0 0 0 0 0 0  Altered sleeping 0 0 0 - -  Tired, decreased energy 0 0 0 - -  Change in appetite 0 0 0 - -  Feeling bad or failure about  yourself  0 0 0 - -  Trouble concentrating 0 0 0 - -  Moving slowly or fidgety/restless 0 0 0 - -  Suicidal thoughts 0 0 0 - -  PHQ-9 Score 0 0 0 - -  Difficult doing work/chores - Not difficult at all Not difficult at all - -    Details       Multiple values from one day are sorted in reverse-chronological order        Interpretation of Total Score  Total Score Depression Severity:  1-4 = Minimal depression, 5-9 = Mild depression, 10-14 = Moderate depression, 15-19 = Moderately severe depression, 20-27 = Severe depression   Psychosocial Evaluation and Intervention:  Psychosocial Evaluation - 11/11/23 1417       Psychosocial Evaluation & Interventions   Interventions Encouraged to exercise with the program and follow exercise prescription    Comments Randy Ingram is coming to cardiac rehab post stents. He states he has been feeling well and already back to work. He states he has no stress concerns at this time and sleeps very well. He is looking forward to attending the program to learn more about heart healthy living    Expected Outcomes Short: attend cardiac rehab for education and exercise Long: develop and maintain positive self care habits    Continue Psychosocial Services  Follow up required by staff          Psychosocial Re-Evaluation:  Psychosocial Re-Evaluation     Row Name 01/05/24 0802             Psychosocial Re-Evaluation   Current issues with None Identified       Comments Randy Ingram denies any anxiety, depression or stress. Reports he sleeps really well. When asked how much sleep he gets he says not enough. only 6-7hrs, but he says he sleep well when he is asleep.       Expected Outcomes STG: Attend rehab more consisitently. LTG: Achieve and maintain a positive outlook on health and daily life       Interventions Encouraged to attend Cardiac Rehabilitation for the exercise       Continue Psychosocial Services  Follow up required by staff           Psychosocial Discharge (Final Psychosocial Re-Evaluation):  Psychosocial Re-Evaluation - 01/05/24 0802       Psychosocial Re-Evaluation   Current issues with None Identified    Comments Randy Ingram denies any anxiety, depression  or stress. Reports he sleeps really well. When asked how much sleep he gets he says not enough. only 6-7hrs, but he says he sleep well when he is asleep.    Expected Outcomes STG: Attend rehab more consisitently. LTG: Achieve and maintain a positive outlook on health and daily life    Interventions Encouraged to attend Cardiac Rehabilitation for the exercise    Continue Psychosocial Services  Follow up required by staff          Vocational Rehabilitation: Provide vocational rehab assistance to qualifying candidates.   Vocational Rehab Evaluation & Intervention:   Education: Education Goals: Education classes will be provided on a variety of topics geared toward better understanding of heart health and risk factor modification. Participant will state understanding/return demonstration of topics presented as noted by education test scores.  Learning Barriers/Preferences:  Learning Barriers/Preferences - 11/11/23 1407       Learning Barriers/Preferences   Learning Barriers None    Learning Preferences None          General Cardiac Education Topics:  AED/CPR: - Group verbal and written instruction with the use of models to demonstrate the basic use of the AED with the basic ABC's of resuscitation.   Test and Procedures: - Group verbal and visual presentation and models provide information about basic cardiac anatomy and function. Reviews the testing methods done to diagnose heart disease and the outcomes of the test results. Describes the treatment choices: Medical Management, Angioplasty, or Coronary Bypass Surgery for treating various heart conditions including Myocardial Infarction, Angina, Valve Disease, and Cardiac Arrhythmias. Written material  provided at class time.   Medication Safety: - Group verbal and visual instruction to review commonly prescribed medications for heart and lung disease. Reviews the medication, class of the drug, and side effects. Includes the steps to properly store meds and maintain the prescription regimen. Written material provided at class time.   Intimacy: - Group verbal instruction through game format to discuss how heart and lung disease can affect sexual intimacy. Written material provided at class time.   Know Your Numbers and Heart Failure: - Group verbal and visual instruction to discuss disease risk factors for cardiac and pulmonary disease and treatment options.  Reviews associated critical values for Overweight/Obesity, Hypertension, Cholesterol, and Diabetes.  Discusses basics of heart failure: signs/symptoms and treatments.  Introduces Heart Failure Zone chart for action plan for heart failure. Written material provided at class time.   Infection Prevention: - Provides verbal and written material to individual with discussion of infection control including proper hand washing and proper equipment cleaning during exercise session. Flowsheet Row Cardiac Rehab from 11/17/2023 in [facility] Cardiac and Pulmonary Rehab  Date 11/17/23  Educator NT  Instruction Review Code 1- Verbalizes Understanding    Falls Prevention: - Provides verbal and written material to individual with discussion of falls prevention and safety. Flowsheet Row Cardiac Rehab from 11/17/2023 in [facility] Cardiac and Pulmonary Rehab  Date 11/17/23  Educator NT  Instruction Review Code 1- Verbalizes Understanding    Other: -Provides group and verbal instruction on various topics (see comments)   Knowledge Questionnaire Score:  Knowledge Questionnaire Score - 11/17/23 0911       Knowledge Questionnaire Score   Pre Score 23/26          Core Components/Risk Factors/Patient Goals at Admission:  Personal Goals and Risk  Factors at Admission - 11/11/23 1405       Core Components/Risk Factors/Patient Goals on Admission    Weight Management  Yes;Weight Loss    Intervention Weight Management: Develop a combined nutrition and exercise program designed to reach desired caloric intake, while maintaining appropriate intake of nutrient and fiber, sodium and fats, and appropriate energy expenditure required for the weight goal.;Weight Management: Provide education and appropriate resources to help participant work on and attain dietary goals.;Weight Management/Obesity: Establish reasonable short term and long term weight goals.    Expected Outcomes Short Term: Continue to assess and modify interventions until short term weight is achieved;Long Term: Adherence to nutrition and physical activity/exercise program aimed toward attainment of established weight goal;Weight Loss: Understanding of general recommendations for a balanced deficit meal plan, which promotes 1-2 lb weight loss per week and includes a negative energy balance of [phone] kcal/d;Understanding recommendations for meals to include 15-35% energy as protein, 25-35% energy from fat, 35-60% energy from carbohydrates, less than 200mg  of dietary cholesterol, 20-35 gm of total fiber daily;Understanding of distribution of calorie intake throughout the day with the consumption of 4-5 meals/snacks    Hypertension Yes    Intervention Provide education on lifestyle modifcations including regular physical activity/exercise, weight management, moderate sodium restriction and increased consumption of fresh fruit, vegetables, and low fat dairy, alcohol moderation, and smoking cessation.;Monitor prescription use compliance.    Expected Outcomes Short Term: Continued assessment and intervention until BP is < 140/44mm HG in hypertensive participants. < 130/17mm HG in hypertensive participants with diabetes, heart failure or chronic kidney disease.;Long Term: Maintenance of blood pressure  at goal levels.    Lipids Yes    Intervention Provide education and support for participant on nutrition & aerobic/resistive exercise along with prescribed medications to achieve LDL 70mg , HDL >40mg .    Expected Outcomes Short Term: Participant states understanding of desired cholesterol values and is compliant with medications prescribed. Participant is following exercise prescription and nutrition guidelines.;Long Term: Cholesterol controlled with medications as prescribed, with individualized exercise RX and with personalized nutrition plan. Value goals: LDL < 70mg , HDL > 40 mg.          Education:Diabetes - Individual verbal and written instruction to review signs/symptoms of diabetes, desired ranges of glucose level fasting, after meals and with exercise. Acknowledge that pre and post exercise glucose checks will be done for 3 sessions at entry of program.   Core Components/Risk Factors/Patient Goals Review:   Goals and Risk Factor Review     Row Name 01/05/24 0808             Core Components/Risk Factors/Patient Goals Review   Personal Goals Review Hypertension       Review Randy Ingram reports he does not check his BP himself but says it is checked here at rehab. Encouraged him to look inot getting a cuff to be able to check his BP at home or when needed.       Expected Outcomes STG: look into getting a BP cuff for home. LTG: Manage risk factors independently          Core Components/Risk Factors/Patient Goals at Discharge (Final Review):   Goals and Risk Factor Review - 01/05/24 0808       Core Components/Risk Factors/Patient Goals Review   Personal Goals Review Hypertension    Review Randy Ingram reports he does not check his BP himself but says it is checked here at rehab. Encouraged him to look inot getting a cuff to be able to check his BP at home or when needed.    Expected Outcomes STG: look into getting a BP cuff for home. LTG:  Manage risk factors independently           ITP Comments:  ITP Comments     Row Name 11/11/23 1420 11/17/23 0908 11/22/23 0735 12/14/23 0849 01/11/24 1058   ITP Comments Initial phone call completed. Diagnosis can be found in [facility] 6/23. EP Orientation scheduled for Thursday 7/31 at 8am. Completed and gym orientation for cardiac rehab. Initial ITP created and sent for review to Dr. Oneil Ingram, Medical Director. First full day of exercise!  Patient was oriented to gym and equipment including functions, settings, policies, and procedures.  Patient's individual exercise prescription and treatment plan were reviewed.  All starting workloads were established based on the results of the 6 minute walk test done at initial orientation visit.  The plan for exercise progression was also introduced and progression will be customized based on patient's performance and goals. 30 Day review completed. Medical Director ITP review done; changes made as directed and signed approval by Medical Director. New to program. 30 Day review completed. Medical Director ITP review done, changes made as directed, and signed approval by Medical Director.      Comments: 30 day review

## 2024-01-12 ENCOUNTER — Encounter: Admitting: Emergency Medicine

## 2024-01-12 DIAGNOSIS — Z955 Presence of coronary angioplasty implant and graft: Secondary | ICD-10-CM | POA: Diagnosis not present

## 2024-01-12 NOTE — Progress Notes (Signed)
 Daily Session Note  Patient Details  Name: Randy Ingram MRN: [id number] Date of Birth: 27-Feb-1956 Referring Provider:   Flowsheet Row Cardiac Rehab from 11/17/2023 in [facility] Cardiac and Pulmonary Rehab  Referring Provider Dr. Murray Khan, MD    Encounter Date: 01/12/2024  Check In:  Session Check In - 01/12/24 0736       Check-In   Supervising physician immediately available to respond to emergencies See telemetry face sheet for immediately available ER MD    Location [facility]-Cardiac & Pulmonary Rehab    Staff Present Devaughn Jaeger, BS, Exercise Physiologist;Joseph Rolinda RCP,RRT,BSRT;Mikle Sternberg RN,BSN    Virtual Visit No    Medication changes reported     No    Fall or balance concerns reported    No    Warm-up and Cool-down Performed on first and last piece of equipment    Resistance Training Performed Yes    VAD Patient? No    PAD/SET Patient? No      Pain Assessment   Currently in Pain? No/denies             Social History   Tobacco Use  Smoking Status Never  Smokeless Tobacco Never    Goals Met:  Independence with exercise equipment Exercise tolerated well No report of concerns or symptoms today Strength training completed today  Goals Unmet:  Not Applicable  Comments: Pt able to follow exercise prescription today without complaint.  Will continue to monitor for progression.    Dr. Oneil Pinal is Medical Director for [facility] Cardiac Rehabilitation.  Dr. Fuad Aleskerov is Medical Director for [facility] Pulmonary Rehabilitation.

## 2024-01-13 ENCOUNTER — Encounter

## 2024-01-17 ENCOUNTER — Encounter

## 2024-01-17 DIAGNOSIS — Z955 Presence of coronary angioplasty implant and graft: Secondary | ICD-10-CM

## 2024-01-17 NOTE — Progress Notes (Signed)
 Daily Session Note  Patient Details  Name: LAWYER WASHABAUGH MRN: [id number] Date of Birth: 1955-10-31 Referring Provider:   Flowsheet Row Cardiac Rehab from 11/17/2023 in [facility] Cardiac and Pulmonary Rehab  Referring Provider Dr. Murray Khan, MD    Encounter Date: 01/17/2024  Check In:  Session Check In - 01/17/24 0738       Check-In   Supervising physician immediately available to respond to emergencies See telemetry face sheet for immediately available ER MD    Location [facility]-Cardiac & Pulmonary Rehab    Staff Present Burnard Davenport RN,BSN,MPA;Margaret Best, MS, Exercise Physiologist;Jason Elnor [facility]    Virtual Visit No    Medication changes reported     No    Fall or balance concerns reported    No    Warm-up and Cool-down Performed on first and last piece of equipment    Resistance Training Performed Yes    VAD Patient? No    PAD/SET Patient? No      Pain Assessment   Currently in Pain? No/denies             Social History   Tobacco Use  Smoking Status Never  Smokeless Tobacco Never    Goals Met:  Independence with exercise equipment Exercise tolerated well No report of concerns or symptoms today Strength training completed today  Goals Unmet:  Not Applicable  Comments: Pt able to follow exercise prescription today without complaint.  Will continue to monitor for progression.    Dr. Oneil Pinal is Medical Director for [facility] Cardiac Rehabilitation.  Dr. Fuad Aleskerov is Medical Director for [facility] Pulmonary Rehabilitation.

## 2024-01-19 ENCOUNTER — Encounter: Attending: Internal Medicine | Admitting: Emergency Medicine

## 2024-01-19 DIAGNOSIS — Z955 Presence of coronary angioplasty implant and graft: Secondary | ICD-10-CM | POA: Insufficient documentation

## 2024-01-19 NOTE — Progress Notes (Signed)
 Daily Session Note  Patient Details  Name: Randy Ingram MRN: [id number] Date of Birth: 12-02-1955 Referring Provider:   Flowsheet Row Cardiac Rehab from 11/17/2023 in [facility] Cardiac and Pulmonary Rehab  Referring Provider Dr. Murray Khan, MD    Encounter Date: 01/19/2024  Check In:  Session Check In - 01/19/24 0730       Check-In   Supervising physician immediately available to respond to emergencies See telemetry face sheet for immediately available ER MD    Location [facility]-Cardiac & Pulmonary Rehab    Staff Present Rollene Paterson, MS, Exercise Physiologist;Kemani Heidel RN,BSN;Joseph Rolinda RCP,RRT,BSRT    Virtual Visit No    Medication changes reported     No    Fall or balance concerns reported    No    Warm-up and Cool-down Performed on first and last piece of equipment    Resistance Training Performed Yes    VAD Patient? No    PAD/SET Patient? No      Pain Assessment   Currently in Pain? No/denies             Social History   Tobacco Use  Smoking Status Never  Smokeless Tobacco Never    Goals Met:  Independence with exercise equipment Exercise tolerated well No report of concerns or symptoms today Strength training completed today  Goals Unmet:  Not Applicable  Comments: Pt able to follow exercise prescription today without complaint.  Will continue to monitor for progression.    Dr. Oneil Pinal is Medical Director for [facility] Cardiac Rehabilitation.  Dr. Fuad Aleskerov is Medical Director for [facility] Pulmonary Rehabilitation.

## 2024-01-20 ENCOUNTER — Encounter

## 2024-01-20 DIAGNOSIS — Z955 Presence of coronary angioplasty implant and graft: Secondary | ICD-10-CM

## 2024-01-20 NOTE — Progress Notes (Signed)
 Daily Session Note  Patient Details  Name: Randy Ingram MRN: [id number] Date of Birth: 07-18-1955 Referring Provider:   Flowsheet Row Cardiac Rehab from 11/17/2023 in [facility] Cardiac and Pulmonary Rehab  Referring Provider Dr. Murray Khan, MD    Encounter Date: 01/20/2024  Check In:  Session Check In - 01/20/24 0730       Check-In   Supervising physician immediately available to respond to emergencies See telemetry face sheet for immediately available ER MD    Location [facility]-Cardiac & Pulmonary Rehab    Staff Present Burnard Davenport RN,BSN,MPA;Laura Cates RN,BSN;Joseph Rolinda RCP,RRT,BSRT    Virtual Visit No    Medication changes reported     No    Fall or balance concerns reported    No    Warm-up and Cool-down Performed on first and last piece of equipment    Resistance Training Performed Yes    VAD Patient? No    PAD/SET Patient? No      Pain Assessment   Currently in Pain? No/denies             Social History   Tobacco Use  Smoking Status Never  Smokeless Tobacco Never    Goals Met:  Independence with exercise equipment Exercise tolerated well No report of concerns or symptoms today Strength training completed today  Goals Unmet:  Not Applicable  Comments: Pt able to follow exercise prescription today without complaint.  Will continue to monitor for progression.    Dr. Oneil Pinal is Medical Director for [facility] Cardiac Rehabilitation.  Dr. Fuad Aleskerov is Medical Director for [facility] Pulmonary Rehabilitation.

## 2024-01-24 ENCOUNTER — Encounter

## 2024-01-24 DIAGNOSIS — Z955 Presence of coronary angioplasty implant and graft: Secondary | ICD-10-CM

## 2024-01-24 NOTE — Progress Notes (Signed)
 Daily Session Note  Patient Details  Name: Randy Ingram MRN: [id number] Date of Birth: 20-Nov-1955 Referring Provider:   Flowsheet Row Cardiac Rehab from 11/17/2023 in [facility] Cardiac and Pulmonary Rehab  Referring Provider Dr. Murray Khan, MD    Encounter Date: 01/24/2024  Check In:  Session Check In - 01/24/24 0736       Check-In   Supervising physician immediately available to respond to emergencies See telemetry face sheet for immediately available ER MD    Location [facility]-Cardiac & Pulmonary Rehab    Staff Present Burnard Davenport RN,BSN,MPA;Jason Elnor RDN,LDN;Margaret Best, MS, Exercise Physiologist    Virtual Visit No    Medication changes reported     No    Fall or balance concerns reported    No    Warm-up and Cool-down Performed on first and last piece of equipment    Resistance Training Performed Yes    VAD Patient? No    PAD/SET Patient? No      Pain Assessment   Currently in Pain? No/denies             Social History   Tobacco Use  Smoking Status Never  Smokeless Tobacco Never    Goals Met:  Independence with exercise equipment Exercise tolerated well No report of concerns or symptoms today Strength training completed today  Goals Unmet:  Not Applicable  Comments: Pt able to follow exercise prescription today without complaint.  Will continue to monitor for progression.    Dr. Oneil Pinal is Medical Director for [facility] Cardiac Rehabilitation.  Dr. Fuad Aleskerov is Medical Director for [facility] Pulmonary Rehabilitation.

## 2024-01-26 ENCOUNTER — Encounter

## 2024-01-27 ENCOUNTER — Encounter

## 2024-01-31 ENCOUNTER — Encounter

## 2024-02-02 ENCOUNTER — Encounter

## 2024-02-02 DIAGNOSIS — Z955 Presence of coronary angioplasty implant and graft: Secondary | ICD-10-CM | POA: Diagnosis not present

## 2024-02-02 NOTE — Progress Notes (Signed)
 Daily Session Note  Patient Details  Name: Randy Ingram MRN: [id number] Date of Birth: 17-Jan-1956 Referring Provider:   Flowsheet Row Cardiac Rehab from 11/17/2023 in [facility] Cardiac and Pulmonary Rehab  Referring Provider Dr. Murray Khan, MD    Encounter Date: 02/02/2024  Check In:  Session Check In - 02/02/24 0734       Check-In   Supervising physician immediately available to respond to emergencies See telemetry face sheet for immediately available ER MD    Location [facility]-Cardiac & Pulmonary Rehab    Staff Present Burnard Davenport RN,BSN,MPA;Joseph Rolinda RCP,RRT,BSRT;Margaret Best, MS, Exercise Physiologist    Virtual Visit No    Medication changes reported     No    Fall or balance concerns reported    No    Warm-up and Cool-down Performed on first and last piece of equipment    Resistance Training Performed Yes    VAD Patient? No    PAD/SET Patient? No      Pain Assessment   Currently in Pain? No/denies             Social History   Tobacco Use  Smoking Status Never  Smokeless Tobacco Never    Goals Met:  Independence with exercise equipment Exercise tolerated well No report of concerns or symptoms today Strength training completed today  Goals Unmet:  Not Applicable  Comments: Pt able to follow exercise prescription today without complaint.  Will continue to monitor for progression.    Dr. Oneil Pinal is Medical Director for [facility] Cardiac Rehabilitation.  Dr. Fuad Aleskerov is Medical Director for [facility] Pulmonary Rehabilitation.

## 2024-02-03 ENCOUNTER — Encounter

## 2024-02-03 DIAGNOSIS — Z955 Presence of coronary angioplasty implant and graft: Secondary | ICD-10-CM | POA: Diagnosis not present

## 2024-02-03 NOTE — Progress Notes (Signed)
 Daily Session Note  Patient Details  Name: Randy Ingram MRN: [id number] Date of Birth: May 25, 1955 Referring Provider:   Flowsheet Row Cardiac Rehab from 11/17/2023 in [facility] Cardiac and Pulmonary Rehab  Referring Provider Dr. Murray Khan, MD    Encounter Date: 02/03/2024  Check In:  Session Check In - 02/03/24 0734       Check-In   Supervising physician immediately available to respond to emergencies See telemetry face sheet for immediately available ER MD    Location [facility]-Cardiac & Pulmonary Rehab    Staff Present Burnard Davenport RN,BSN,MPA;Noah Tickle, BS, Exercise Physiologist;Laureen Delores, BS, RRT, CPFT    Virtual Visit No    Medication changes reported     No    Fall or balance concerns reported    No    Warm-up and Cool-down Performed on first and last piece of equipment    Resistance Training Performed Yes    VAD Patient? No    PAD/SET Patient? No      Pain Assessment   Currently in Pain? No/denies             Social History   Tobacco Use  Smoking Status Never  Smokeless Tobacco Never    Goals Met:  Independence with exercise equipment Exercise tolerated well No report of concerns or symptoms today Strength training completed today  Goals Unmet:  Not Applicable  Comments: Pt able to follow exercise prescription today without complaint.  Will continue to monitor for progression.    Dr. Oneil Pinal is Medical Director for [facility] Cardiac Rehabilitation.  Dr. Fuad Aleskerov is Medical Director for [facility] Pulmonary Rehabilitation.

## 2024-02-07 ENCOUNTER — Encounter

## 2024-02-08 ENCOUNTER — Encounter

## 2024-02-08 ENCOUNTER — Encounter: Payer: Self-pay | Admitting: *Deleted

## 2024-02-08 DIAGNOSIS — Z955 Presence of coronary angioplasty implant and graft: Secondary | ICD-10-CM

## 2024-02-08 NOTE — Progress Notes (Signed)
 Cardiac Individual Treatment Plan  Patient Details  Name: Randy Ingram MRN: [id number] Date of Birth: 06-22-1955 Referring Provider:   Flowsheet Row Cardiac Rehab from 11/17/2023 in [facility] Cardiac and Pulmonary Rehab  Referring Provider Dr. Murray Khan, MD    Initial Encounter Date:  Flowsheet Row Cardiac Rehab from 11/17/2023 in [facility] Cardiac and Pulmonary Rehab  Date 11/17/23    Visit Diagnosis: Status post coronary artery stent placement  Patient's Home Medications on Admission:  Current Outpatient Medications:    amLODipine  (NORVASC ) 10 MG tablet, Take 1 tablet (10 mg total) by mouth daily., Disp: 90 tablet, Rfl: 3   aspirin  EC 81 MG tablet, Take 81 mg by mouth in the morning. Swallow whole., Disp: , Rfl:    B COMPLEX VITAMINS PO, Take 1 tablet by mouth in the morning., Disp: , Rfl:    chlorthalidone  (HYGROTON ) 50 MG tablet, Take 50 mg by mouth in the morning., Disp: , Rfl:    cholecalciferol (VITAMIN D3) 25 MCG (1000 UNIT) tablet, Take 1,000 Units by mouth in the morning., Disp: , Rfl:    clopidogrel  (PLAVIX ) 75 MG tablet, Take 1 tablet (75 mg total) by mouth daily., Disp: 60 tablet, Rfl: 0   clopidogrel  (PLAVIX ) 75 MG tablet, Take 1 tablet (75 mg total) by mouth daily., Disp: 60 tablet, Rfl: 0   isosorbide  mononitrate (IMDUR ) 30 MG 24 hr tablet, Take 1 tablet (30 mg total) by mouth daily., Disp: 30 tablet, Rfl: 0   isosorbide  mononitrate (IMDUR ) 60 MG 24 hr tablet, Take 60 mg by mouth daily., Disp: , Rfl:    Krill Oil 1000 MG CAPS, Take 1,000 mg by mouth in the morning., Disp: , Rfl:    losartan  (COZAAR ) 100 MG tablet, Take 1 tablet (100 mg total) by mouth daily., Disp: 90 tablet, Rfl: 0   metoprolol succinate (TOPROL-XL) 25 MG 24 hr tablet, Take 25 mg by mouth in the morning., Disp: , Rfl:    OVER THE COUNTER MEDICATION, Take 1 Scoop by mouth in the morning. Oligomeric proanthocyanidins (OPC), Disp: , Rfl:    Potassium 99 MG TABS, Take 99 mg by mouth in the morning., Disp: , Rfl:     Resveratrol 100 MG CAPS, Take 100 mg by mouth in the morning., Disp: , Rfl:    rosuvastatin  (CRESTOR ) 40 MG tablet, Take 40 mg by mouth every evening., Disp: , Rfl:   Past Medical History: Past Medical History:  Diagnosis Date   Hypertension    controlled on meds    Tobacco Use: Social History   Tobacco Use  Smoking Status Never  Smokeless Tobacco Never    Labs: Review Flowsheet  More data exists      Latest Ref Rng & Units 10/28/2016 11/09/2017 11/29/2018 01/01/2020 02/20/2021  Labs for ITP Cardiac and Pulmonary Rehab  Cholestrol 100 - 199 mg/dL 803  799  801  802  814   LDL (calc) 0 - 99 mg/dL 878  869  871  880  887   HDL-C >39 mg/dL 46  47  45  46  46   Trlycerides 0 - 149 mg/dL 856  883  876  818  845   Hemoglobin A1c 4.8 - 5.6 % - - - - 5.5      Exercise Target Goals: Exercise Program Goal: Individual exercise prescription set using results from initial 6 min walk test and THRR while considering  patient's activity barriers and safety.   Exercise Prescription Goal: Initial exercise prescription builds to 30-45  minutes a day of aerobic activity, 2-3 days per week.  Home exercise guidelines will be given to patient during program as part of exercise prescription that the participant will acknowledge.   Education: Aerobic Exercise: - Group verbal and visual presentation on the components of exercise prescription. Introduces F.I.T.T principle from ACSM for exercise prescriptions.  Reviews F.I.T.T. principles of aerobic exercise including progression. Written material provided at class time. Flowsheet Row Cardiac Rehab from 11/17/2023 in [facility] Cardiac and Pulmonary Rehab  Education need identified 11/17/23    Education: Resistance Exercise: - Group verbal and visual presentation on the components of exercise prescription. Introduces F.I.T.T principle from ACSM for exercise prescriptions  Reviews F.I.T.T. principles of resistance exercise including progression. Written  material provided at class time.    Education: Exercise & Equipment Safety: - Individual verbal instruction and demonstration of equipment use and safety with use of the equipment. Flowsheet Row Cardiac Rehab from 11/17/2023 in [facility] Cardiac and Pulmonary Rehab  Date 11/17/23  Educator NT  Instruction Review Code 1- Verbalizes Understanding    Education: Exercise Physiology & General Exercise Guidelines: - Group verbal and written instruction with models to review the exercise physiology of the cardiovascular system and associated critical values. Provides general exercise guidelines with specific guidelines to those with heart or lung disease. Written material provided at class time.   Education: Flexibility, Balance, Mind/Body Relaxation: - Group verbal and visual presentation with interactive activity on the components of exercise prescription. Introduces F.I.T.T principle from ACSM for exercise prescriptions. Reviews F.I.T.T. principles of flexibility and balance exercise training including progression. Also discusses the mind body connection.  Reviews various relaxation techniques to help reduce and manage stress (i.e. Deep breathing, progressive muscle relaxation, and visualization). Balance handout provided to take home. Written material provided at class time.   Activity Barriers & Risk Stratification:  Activity Barriers & Cardiac Risk Stratification - 11/11/23 1402       Activity Barriers & Cardiac Risk Stratification   Activity Barriers Other (comment)    Comments left foot plantar fascitis    Cardiac Risk Stratification Moderate          6 Minute Walk:  6 Minute Walk     Row Name 11/17/23 0916         6 Minute Walk   Phase Initial     Distance 1500 feet     Walk Time 6 minutes     # of Rest Breaks 0     MPH 2.84     METS 3.37     RPE 11     Perceived Dyspnea  0     VO2 Peak 11.8     Symptoms No     Resting HR 59 bpm     Resting BP 126/74     Resting Oxygen  Saturation  96 %     Exercise Oxygen Saturation  during 6 min walk 94 %     Max Ex. HR 98 bpm     Max Ex. BP 144/72     2 Minute Post BP 128/70        Oxygen Initial Assessment:   Oxygen Re-Evaluation:   Oxygen Discharge (Final Oxygen Re-Evaluation):   Initial Exercise Prescription:  Initial Exercise Prescription - 11/17/23 0900       Date of Initial Exercise RX and Referring Provider   Date 11/17/23    Referring Provider Dr. Murray Khan, MD      Oxygen   Maintain Oxygen Saturation 88% or higher  Treadmill   MPH 2.8    Grade 1    Minutes 15    METs 3.53      REL-XR   Level 3    Speed 50    Minutes 15    METs 3.37      Rower   Level 4    Watts 35    Minutes 15    METs 3.37      Prescription Details   Frequency (times per week) 3    Duration Progress to 30 minutes of continuous aerobic without signs/symptoms of physical distress      Intensity   THRR 40-80% of Max Heartrate 97-135    Ratings of Perceived Exertion 11-13    Perceived Dyspnea 0-4      Progression   Progression Continue to progress workloads to maintain intensity without signs/symptoms of physical distress.      Resistance Training   Training Prescription Yes    Weight 7 lb    Reps 10-15          Perform Capillary Blood Glucose checks as needed.  Exercise Prescription Changes:   Exercise Prescription Changes     Row Name 11/17/23 0900 12/08/23 0900 12/22/23 1600 01/17/24 0800 01/18/24 1000     Response to Exercise   Blood Pressure (Admit) 126/74 118/62 126/62 -- 118/56   Blood Pressure (Exercise) 144/72 160/82 168/82 -- 150/70   Blood Pressure (Exit) 128/70 102/62 114/62 -- 114/64   Heart Rate (Admit) 59 bpm 60 bpm 66 bpm -- 63 bpm   Heart Rate (Exercise) 98 bpm 140 bpm 131 bpm -- 140 bpm   Heart Rate (Exit) 69 bpm 75 bpm 80 bpm -- 80 bpm   Oxygen Saturation (Admit) 96 % -- -- -- --   Oxygen Saturation (Exercise) 94 % -- -- -- --   Rating of Perceived Exertion  (Exercise) 11 14 14  -- 14   Perceived Dyspnea (Exercise) 0 0 0 -- --   Symptoms none none none -- none   Comments Results first 2 weeks of exercise -- -- --   Duration -- Progress to 30 minutes of  aerobic without signs/symptoms of physical distress Progress to 30 minutes of  aerobic without signs/symptoms of physical distress -- Continue with 30 min of aerobic exercise without signs/symptoms of physical distress.   Intensity -- THRR unchanged THRR unchanged -- THRR unchanged     Progression   Progression -- Continue to progress workloads to maintain intensity without signs/symptoms of physical distress. Continue to progress workloads to maintain intensity without signs/symptoms of physical distress. -- Continue to progress workloads to maintain intensity without signs/symptoms of physical distress.   Average METs -- 4.72 5.46 -- 5.26     Resistance Training   Training Prescription -- Yes Yes -- Yes   Weight -- 7 lb 7 lb -- 7 lb   Reps -- 10-15 10-15 -- 10-15     Interval Training   Interval Training -- No No -- No     Treadmill   MPH -- 3.1 3.2 -- 3.2   Grade -- 2.5 3 -- 2.5   Minutes -- 15 15 -- 15   METs -- 4.44 4.77 -- 4.55     REL-XR   Level -- 10 8 -- 10   Minutes -- 15 15 -- 15   METs -- 7.8 7.5 -- 7.2     Rower   Level -- 10 10 -- 5   Watts -- 60 56 --  71   Minutes -- 15 15 -- 15   METs -- 4.8 4.81 -- 6.11     Home Exercise Plan   Plans to continue exercise at -- -- -- [employer] (comment)  Plans to join [employer] for aerobic machines [employer] (comment)  Plans to join [employer] for aerobic machines   Frequency -- -- -- Add 2 additional days to program exercise sessions. Add 2 additional days to program exercise sessions.   Initial Home Exercises Provided -- -- -- 01/17/24 01/17/24     Oxygen   Maintain Oxygen Saturation -- 88% or higher 88% or higher 88% or higher 88% or higher    Row Name 02/03/24 1000             Response to  Exercise   Blood Pressure (Admit) 114/60       Blood Pressure (Exit) 120/72       Heart Rate (Admit) 65 bpm       Heart Rate (Exercise) 144 bpm       Heart Rate (Exit) 92 bpm       Rating of Perceived Exertion (Exercise) 16       Symptoms none       Duration Continue with 30 min of aerobic exercise without signs/symptoms of physical distress.       Intensity THRR unchanged         Progression   Progression Continue to progress workloads to maintain intensity without signs/symptoms of physical distress.       Average METs 6.3         Resistance Training   Training Prescription Yes       Weight 7 lb       Reps 10-15         Interval Training   Interval Training No         Treadmill   MPH 3.2       Grade 3.5       Minutes 15       METs 4.99         REL-XR   Level 10       Minutes 15       METs 8.1         Rower   Level 10       Watts 91       Minutes 15       METs 6.13         Home Exercise Plan   Plans to continue exercise at [employer] (comment)  Plans to join [employer] for aerobic machines       Frequency Add 2 additional days to program exercise sessions.       Initial Home Exercises Provided 01/17/24         Oxygen   Maintain Oxygen Saturation 88% or higher          Exercise Comments:   Exercise Comments     Row Name 11/22/23 0735           Exercise Comments First full day of exercise!  Patient was oriented to gym and equipment including functions, settings, policies, and procedures.  Patient's individual exercise prescription and treatment plan were reviewed.  All starting workloads were established based on the results of the 6 minute walk test done at initial orientation visit.  The plan for exercise progression was also introduced and progression will be customized based on patient's performance and goals.  Exercise Goals and Review:   Exercise Goals     Row Name 11/17/23 0917             Exercise Goals   Increase  Physical Activity Yes       Intervention Provide advice, education, support and counseling about physical activity/exercise needs.;Develop an individualized exercise prescription for aerobic and resistive training based on initial evaluation findings, risk stratification, comorbidities and participant's personal goals.       Expected Outcomes Short Term: Attend rehab on a regular basis to increase amount of physical activity.;Long Term: Add in home exercise to make exercise part of routine and to increase amount of physical activity.;Long Term: Exercising regularly at least 3-5 days a week.       Increase Strength and Stamina Yes       Intervention Provide advice, education, support and counseling about physical activity/exercise needs.;Develop an individualized exercise prescription for aerobic and resistive training based on initial evaluation findings, risk stratification, comorbidities and participant's personal goals.       Expected Outcomes Long Term: Improve cardiorespiratory fitness, muscular endurance and strength as measured by increased METs and functional capacity ( );Short Term: Increase workloads from initial exercise prescription for resistance, speed, and METs.;Short Term: Perform resistance training exercises routinely during rehab and add in resistance training at home       Able to understand and use rate of perceived exertion (RPE) scale Yes       Intervention Provide education and explanation on how to use RPE scale       Expected Outcomes Short Term: Able to use RPE daily in rehab to express subjective intensity level;Long Term:  Able to use RPE to guide intensity level when exercising independently       Able to understand and use Dyspnea scale Yes       Intervention Provide education and explanation on how to use Dyspnea scale       Expected Outcomes Short Term: Able to use Dyspnea scale daily in rehab to express subjective sense of shortness of breath during exertion;Long Term:  Able to use Dyspnea scale to guide intensity level when exercising independently       Knowledge and understanding of Target Heart Rate Range (THRR) Yes       Intervention Provide education and explanation of THRR including how the numbers were predicted and where they are located for reference       Expected Outcomes Short Term: Able to state/look up THRR;Long Term: Able to use THRR to govern intensity when exercising independently;Short Term: Able to use daily as guideline for intensity in rehab       Able to check pulse independently Yes       Intervention Review the importance of being able to check your own pulse for safety during independent exercise;Provide education and demonstration on how to check pulse in carotid and radial arteries.       Expected Outcomes Short Term: Able to explain why pulse checking is important during independent exercise;Long Term: Able to check pulse independently and accurately       Understanding of Exercise Prescription Yes       Intervention Provide education, explanation, and written materials on patient's individual exercise prescription       Expected Outcomes Short Term: Able to explain program exercise prescription;Long Term: Able to explain home exercise prescription to exercise independently          Exercise Goals Re-Evaluation :  Exercise Goals Re-Evaluation  Row Name 11/22/23 0735 12/08/23 0936 12/22/23 1617 01/05/24 0800 01/05/24 1403     Exercise Goal Re-Evaluation   Exercise Goals Review Increase Physical Activity;Able to understand and use rate of perceived exertion (RPE) scale;Knowledge and understanding of Target Heart Rate Range (THRR);Understanding of Exercise Prescription;Increase Strength and Stamina;Able to understand and use Dyspnea scale;Able to check pulse independently Increase Physical Activity;Increase Strength and Stamina;Understanding of Exercise Prescription Increase Physical Activity;Increase Strength and  Stamina;Understanding of Exercise Prescription Increase Physical Activity;Increase Strength and Stamina;Understanding of Exercise Prescription Increase Physical Activity;Increase Strength and Stamina;Understanding of Exercise Prescription   Comments Reviewed RPE and dyspnea scale, THR and program prescription with pt today.  Pt voiced understanding and was given a copy of goals to take home. Lennart is off to a good start in the program, and was able to attend his first few sessions during this review period. During these sessions he was able to increase his workload on the treadmill to a speed of 3. and an incline of 2.5%. He was also able to increase from level 3 to 10 on the XR. We will continue to monitor his progress in the program. Haadi is doing well in rehab. He was recently able to increase his workload on the treadmill to a speed of 3.2mph and 3% incline. He was able to maintain level 10 on the rowing machine. We will continue to monitor his progress in the program. Dayshon is doing well at rehab. He says he works hard at home. Not intentional exercise, but is active every day. Encouraged him to add more intentional exercise into his home routine Jyquan did not attend rehab during the last review period. He did return to the program on 01/03/2024. We will continue to monitor his progress in the program.   Expected Outcomes Short: Use RPE daily to regulate intensity. Long: Follow program prescription in THR. Short: Continue to follow exercise prescription. Long: Continue exercise to improve strength and stamina. Short: Continue to increase treadmill workloads. Long: Continue exercise to improve strength and stamina. STg: Look to add more intentional exercise. LTG: Continue exercise to improve strength and stamina. Short: Return to consistent attendance in rehab. Long: Graduate.    Row Name 01/17/24 9193 01/18/24 1004 02/03/24 1040         Exercise Goal Re-Evaluation   Exercise Goals Review  Increase Strength and Stamina;Understanding of Exercise Prescription;Able to understand and use Dyspnea scale;Increase Physical Activity;Knowledge and understanding of Target Heart Rate Range (THRR);Able to check pulse independently;Able to understand and use rate of perceived exertion (RPE) scale Increase Physical Activity;Increase Strength and Stamina;Understanding of Exercise Prescription Increase Physical Activity;Increase Strength and Stamina;Understanding of Exercise Prescription     Comments Reviewed home exercise with pt today.  Pt plans to join [employer] for aerobic machines and resistance training.  Reviewed THR, pulse, RPE, sign and symptoms, pulse oximetery and when to call 911 or MD.  Also discussed weather considerations and indoor options.  Pt voiced understanding. Terren continues to do well in the program. He continues to walk the treadmill at a speed of 3.2 mph with a decreased incline at 2.5%. He also improved back up to level 10 on the XR. We will continue to monitor his progress in the program. Haneef is doing well in rehab. He has been able to increase his treadmill incline from 2.5 to 3.5%, as well as increase from level 5 to 10 on the rowing machine. We will continue to monitor his progress in the program.  Expected Outcomes Short: Begin going to gym for exercise on days away from rehab. Long: Graduate and continue exercise independently. Short: Increase treadmill workload back up to previous incline. Long: Continue exercise to improve strength and stamina. Short: Continue to increase treadmill workload. Long: Continue exercise to improve strength and stamina.        Discharge Exercise Prescription (Final Exercise Prescription Changes):  Exercise Prescription Changes - 02/03/24 1000       Response to Exercise   Blood Pressure (Admit) 114/60    Blood Pressure (Exit) 120/72    Heart Rate (Admit) 65 bpm    Heart Rate (Exercise) 144 bpm    Heart Rate (Exit) 92 bpm     Rating of Perceived Exertion (Exercise) 16    Symptoms none    Duration Continue with 30 min of aerobic exercise without signs/symptoms of physical distress.    Intensity THRR unchanged      Progression   Progression Continue to progress workloads to maintain intensity without signs/symptoms of physical distress.    Average METs 6.3      Resistance Training   Training Prescription Yes    Weight 7 lb    Reps 10-15      Interval Training   Interval Training No      Treadmill   MPH 3.2    Grade 3.5    Minutes 15    METs 4.99      REL-XR   Level 10    Minutes 15    METs 8.1      Rower   Level 10    Watts 91    Minutes 15    METs 6.13      Home Exercise Plan   Plans to continue exercise at [employer] (comment)   Plans to join [employer] for aerobic machines   Frequency Add 2 additional days to program exercise sessions.    Initial Home Exercises Provided 01/17/24      Oxygen   Maintain Oxygen Saturation 88% or higher          Nutrition:  Target Goals: Understanding of nutrition guidelines, daily intake of sodium 1500mg , cholesterol 200mg , calories 30% from fat and 7% or less from saturated fats, daily to have 5 or more servings of fruits and vegetables.  Education: Nutrition 1 -Group instruction provided by verbal, written material, interactive activities, discussions, models, and posters to present general guidelines for heart healthy nutrition including macronutrients, label reading, and promoting whole foods over processed counterparts. Education serves as Pensions consultant of discussion of heart healthy eating for all. Written material provided at class time.    Education: Nutrition 2 -Group instruction provided by verbal, written material, interactive activities, discussions, models, and posters to present general guidelines for heart healthy nutrition including sodium, cholesterol, and saturated fat. Providing guidance of habit forming to improve blood  pressure, cholesterol, and body weight. Written material provided at class time.     Biometrics:  Pre Biometrics - 11/17/23 0918       Pre Biometrics   Height 5' 9.5 (1.765 m)    Weight 212 lb 1.6 oz (96.2 kg)    Waist Circumference 42 inches    Hip Circumference 40 inches    Waist to Hip Ratio 1.05 %    BMI (Calculated) 30.88    Single Leg Stand 30 seconds           Nutrition Therapy Plan and Nutrition Goals:  Nutrition Therapy & Goals - 11/17/23 9086  Nutrition Therapy   RD appointment deferred Yes      Intervention Plan   Intervention Prescribe, educate and counsel regarding individualized specific dietary modifications aiming towards targeted core components such as weight, hypertension, lipid management, diabetes, heart failure and other comorbidities.    Expected Outcomes Short Term Goal: Understand basic principles of dietary content, such as calories, fat, sodium, cholesterol and nutrients.;Short Term Goal: A plan has been developed with personal nutrition goals set during dietitian appointment.;Long Term Goal: Adherence to prescribed nutrition plan.          Nutrition Assessments:  MEDIFICTS Score Key: >=70 Need to make dietary changes  40-70 Heart Healthy Diet <= 40 Therapeutic Level Cholesterol Diet  Flowsheet Row Cardiac Rehab from 11/17/2023 in [facility] Cardiac and Pulmonary Rehab  Picture Your Plate Total Score on Admission 67   Picture Your Plate Scores: <59 Unhealthy dietary pattern with much room for improvement. 41-50 Dietary pattern unlikely to meet recommendations for good health and room for improvement. 51-60 More healthful dietary pattern, with some room for improvement.  >60 Healthy dietary pattern, although there may be some specific behaviors that could be improved.    Nutrition Goals Re-Evaluation:  Nutrition Goals Re-Evaluation     Row Name 01/05/24 0805 01/24/24 9187           Goals   Comment RD appointment deferred. (Spoke  with Wyman about nutrition. He report he is eating eggs and bacon most mornings. He drinks whole milk and eats whole yogurt. Stays away from carbs. Discussed the high saturated fat nature of bacon and whole dairy products. Educated on the importance of whole grains and not to avoid quality carbs. He says he has heard that before but is choosing to follow the advice of another cardiologist) RD appointment deferred. (Spoke with Lucifer about nutrition. He report he is eating eggs and bacon most mornings. He drinks whole milk and eats whole yogurt. Stays away from carbs. Discussed the high saturated fat nature of bacon and whole dairy products. Educated on the importance of whole grains and not to avoid quality carbs. He says he has heard that before but is choosing to follow the advice of book he read by a surgeon)         Nutrition Goals Discharge (Final Nutrition Goals Re-Evaluation):  Nutrition Goals Re-Evaluation - 01/24/24 9187       Goals   Comment RD appointment deferred. (Spoke with Larri about nutrition. He report he is eating eggs and bacon most mornings. He drinks whole milk and eats whole yogurt. Stays away from carbs. Discussed the high saturated fat nature of bacon and whole dairy products. Educated on the importance of whole grains and not to avoid quality carbs. He says he has heard that before but is choosing to follow the advice of book he read by a surgeon)          Psychosocial: Target Goals: Acknowledge presence or absence of significant depression and/or stress, maximize coping skills, provide positive support system. Participant is able to verbalize types and ability to use techniques and skills needed for reducing stress and depression.   Education: Stress, Anxiety, and Depression - Group verbal and visual presentation to define topics covered.  Reviews how body is impacted by stress, anxiety, and depression.  Also discusses healthy ways to reduce stress and to treat/manage  anxiety and depression. Written material provided at class time.   Education: Sleep Hygiene -Provides group verbal and written instruction about how sleep can affect  your health.  Define sleep hygiene, discuss sleep cycles and impact of sleep habits. Review good sleep hygiene tips.   Initial Review & Psychosocial Screening:  Initial Psych Review & Screening - 11/11/23 1412       Initial Review   Current issues with None Identified      Family Dynamics   Good Support System? Yes   family     Barriers   Psychosocial barriers to participate in program There are no identifiable barriers or psychosocial needs.;The patient should benefit from training in stress management and relaxation.      Screening Interventions   Interventions To provide support and resources with identified psychosocial needs;Encouraged to exercise;Provide feedback about the scores to participant    Expected Outcomes Short Term goal: Utilizing psychosocial counselor, staff and physician to assist with identification of specific Stressors or current issues interfering with healing process. Setting desired goal for each stressor or current issue identified.;Long Term Goal: Stressors or current issues are controlled or eliminated.;Short Term goal: Identification and review with participant of any Quality of Life or Depression concerns found by scoring the questionnaire.;Long Term goal: The participant improves quality of Life and PHQ9 Scores as seen by post scores and/or verbalization of changes          Quality of Life Scores:   Quality of Life - 11/17/23 0911       Quality of Life   Select Quality of Life      Quality of Life Scores   Health/Function Pre 27.6 %    Socioeconomic Pre 27.38 %    Psych/Spiritual Pre 28.29 %    Family Pre 30 %    GLOBAL Pre 28.03 %         Scores of 19 and below usually indicate a poorer quality of life in these areas.  A difference of  2-3 points is a clinically meaningful  difference.  A difference of 2-3 points in the total score of the Quality of Life Index has been associated with significant improvement in overall quality of life, self-image, physical symptoms, and general health in studies assessing change in quality of life.  PHQ-9: Review Flowsheet  More data exists      11/17/2023 08/18/2020 01/01/2020 11/27/2018 11/08/2017  Depression screen PHQ 2/9  Decreased Interest 0 0 0 0 0 0  Down, Depressed, Hopeless 0 0 0 0 0  PHQ - 2 Score 0 0 0 0 0 0  Altered sleeping 0 0 0 - -  Tired, decreased energy 0 0 0 - -  Change in appetite 0 0 0 - -  Feeling bad or failure about yourself  0 0 0 - -  Trouble concentrating 0 0 0 - -  Moving slowly or fidgety/restless 0 0 0 - -  Suicidal thoughts 0 0 0 - -  PHQ-9 Score 0 0 0 - -  Difficult doing work/chores - Not difficult at all Not difficult at all - -    Details       Multiple values from one day are sorted in reverse-chronological order        Interpretation of Total Score  Total Score Depression Severity:  1-4 = Minimal depression, 5-9 = Mild depression, 10-14 = Moderate depression, 15-19 = Moderately severe depression, 20-27 = Severe depression   Psychosocial Evaluation and Intervention:  Psychosocial Evaluation - 11/11/23 1417       Psychosocial Evaluation & Interventions   Interventions Encouraged to exercise with the program and follow exercise  prescription    Comments Mr. Chalk is coming to cardiac rehab post stents. He states he has been feeling well and already back to work. He states he has no stress concerns at this time and sleeps very well. He is looking forward to attending the program to learn more about heart healthy living    Expected Outcomes Short: attend cardiac rehab for education and exercise Long: develop and maintain positive self care habits    Continue Psychosocial Services  Follow up required by staff          Psychosocial Re-Evaluation:  Psychosocial Re-Evaluation      Row Name 01/05/24 0802 01/24/24 0810           Psychosocial Re-Evaluation   Current issues with None Identified None Identified      Comments Karron denies any anxiety, depression or stress. Reports he sleeps really well. When asked how much sleep he gets he says not enough. only 6-7hrs, but he says he sleep well when he is asleep. Brennan reports he sleeps well, 6-7hrs. he denies any anxiety, depression, and stress at this time.      Expected Outcomes STG: Attend rehab more consisitently. LTG: Achieve and maintain a positive outlook on health and daily life STG: Attend rehab more consisitently. LTG: Achieve and maintain a positive outlook on health and daily life      Interventions Encouraged to attend Cardiac Rehabilitation for the exercise Encouraged to attend Cardiac Rehabilitation for the exercise      Continue Psychosocial Services  Follow up required by staff Follow up required by staff         Psychosocial Discharge (Final Psychosocial Re-Evaluation):  Psychosocial Re-Evaluation - 01/24/24 0810       Psychosocial Re-Evaluation   Current issues with None Identified    Comments Wesson reports he sleeps well, 6-7hrs. he denies any anxiety, depression, and stress at this time.    Expected Outcomes STG: Attend rehab more consisitently. LTG: Achieve and maintain a positive outlook on health and daily life    Interventions Encouraged to attend Cardiac Rehabilitation for the exercise    Continue Psychosocial Services  Follow up required by staff          Vocational Rehabilitation: Provide vocational rehab assistance to qualifying candidates.   Vocational Rehab Evaluation & Intervention:   Education: Education Goals: Education classes will be provided on a variety of topics geared toward better understanding of heart health and risk factor modification. Participant will state understanding/return demonstration of topics presented as noted by education test scores.  Learning  Barriers/Preferences:  Learning Barriers/Preferences - 11/11/23 1407       Learning Barriers/Preferences   Learning Barriers None    Learning Preferences None          General Cardiac Education Topics:  AED/CPR: - Group verbal and written instruction with the use of models to demonstrate the basic use of the AED with the basic ABC's of resuscitation.   Test and Procedures: - Group verbal and visual presentation and models provide information about basic cardiac anatomy and function. Reviews the testing methods done to diagnose heart disease and the outcomes of the test results. Describes the treatment choices: Medical Management, Angioplasty, or Coronary Bypass Surgery for treating various heart conditions including Myocardial Infarction, Angina, Valve Disease, and Cardiac Arrhythmias. Written material provided at class time.   Medication Safety: - Group verbal and visual instruction to review commonly prescribed medications for heart and lung disease. Reviews the medication, class of  the drug, and side effects. Includes the steps to properly store meds and maintain the prescription regimen. Written material provided at class time.   Intimacy: - Group verbal instruction through game format to discuss how heart and lung disease can affect sexual intimacy. Written material provided at class time.   Know Your Numbers and Heart Failure: - Group verbal and visual instruction to discuss disease risk factors for cardiac and pulmonary disease and treatment options.  Reviews associated critical values for Overweight/Obesity, Hypertension, Cholesterol, and Diabetes.  Discusses basics of heart failure: signs/symptoms and treatments.  Introduces Heart Failure Zone chart for action plan for heart failure. Written material provided at class time.   Infection Prevention: - Provides verbal and written material to individual with discussion of infection control including proper hand washing and  proper equipment cleaning during exercise session. Flowsheet Row Cardiac Rehab from 11/17/2023 in [facility] Cardiac and Pulmonary Rehab  Date 11/17/23  Educator NT  Instruction Review Code 1- Verbalizes Understanding    Falls Prevention: - Provides verbal and written material to individual with discussion of falls prevention and safety. Flowsheet Row Cardiac Rehab from 11/17/2023 in [facility] Cardiac and Pulmonary Rehab  Date 11/17/23  Educator NT  Instruction Review Code 1- Verbalizes Understanding    Other: -Provides group and verbal instruction on various topics (see comments)   Knowledge Questionnaire Score:  Knowledge Questionnaire Score - 11/17/23 0911       Knowledge Questionnaire Score   Pre Score 23/26          Core Components/Risk Factors/Patient Goals at Admission:  Personal Goals and Risk Factors at Admission - 11/11/23 1405       Core Components/Risk Factors/Patient Goals on Admission    Weight Management Yes;Weight Loss    Intervention Weight Management: Develop a combined nutrition and exercise program designed to reach desired caloric intake, while maintaining appropriate intake of nutrient and fiber, sodium and fats, and appropriate energy expenditure required for the weight goal.;Weight Management: Provide education and appropriate resources to help participant work on and attain dietary goals.;Weight Management/Obesity: Establish reasonable short term and long term weight goals.    Expected Outcomes Short Term: Continue to assess and modify interventions until short term weight is achieved;Long Term: Adherence to nutrition and physical activity/exercise program aimed toward attainment of established weight goal;Weight Loss: Understanding of general recommendations for a balanced deficit meal plan, which promotes 1-2 lb weight loss per week and includes a negative energy balance of [phone] kcal/d;Understanding recommendations for meals to include 15-35% energy as protein,  25-35% energy from fat, 35-60% energy from carbohydrates, less than 200mg  of dietary cholesterol, 20-35 gm of total fiber daily;Understanding of distribution of calorie intake throughout the day with the consumption of 4-5 meals/snacks    Hypertension Yes    Intervention Provide education on lifestyle modifcations including regular physical activity/exercise, weight management, moderate sodium restriction and increased consumption of fresh fruit, vegetables, and low fat dairy, alcohol moderation, and smoking cessation.;Monitor prescription use compliance.    Expected Outcomes Short Term: Continued assessment and intervention until BP is < 140/52mm HG in hypertensive participants. < 130/64mm HG in hypertensive participants with diabetes, heart failure or chronic kidney disease.;Long Term: Maintenance of blood pressure at goal levels.    Lipids Yes    Intervention Provide education and support for participant on nutrition & aerobic/resistive exercise along with prescribed medications to achieve LDL 70mg , HDL >40mg .    Expected Outcomes Short Term: Participant states understanding of desired cholesterol values and is compliant  with medications prescribed. Participant is following exercise prescription and nutrition guidelines.;Long Term: Cholesterol controlled with medications as prescribed, with individualized exercise RX and with personalized nutrition plan. Value goals: LDL < 70mg , HDL > 40 mg.          Education:Diabetes - Individual verbal and written instruction to review signs/symptoms of diabetes, desired ranges of glucose level fasting, after meals and with exercise. Acknowledge that pre and post exercise glucose checks will be done for 3 sessions at entry of program.   Core Components/Risk Factors/Patient Goals Review:   Goals and Risk Factor Review     Row Name 01/05/24 9191 01/24/24 0813           Core Components/Risk Factors/Patient Goals Review   Personal Goals Review  Hypertension Hypertension      Review Hisao reports he does not check his BP himself but says it is checked here at rehab. Encouraged him to look inot getting a cuff to be able to check his BP at home or when needed. Drexel does not check his BP at home, does not have cuff at home. He says he may or may not get one. Encouraged him to get one and start checking his BP at home.      Expected Outcomes STG: look into getting a BP cuff for home. LTG: Manage risk factors independently STG: look into getting a BP cuff for home. LTG: Manage risk factors independently         Core Components/Risk Factors/Patient Goals at Discharge (Final Review):   Goals and Risk Factor Review - 01/24/24 0813       Core Components/Risk Factors/Patient Goals Review   Personal Goals Review Hypertension    Review Conard does not check his BP at home, does not have cuff at home. He says he may or may not get one. Encouraged him to get one and start checking his BP at home.    Expected Outcomes STG: look into getting a BP cuff for home. LTG: Manage risk factors independently          ITP Comments:  ITP Comments     Row Name 11/11/23 1420 11/17/23 0908 11/22/23 0735 12/14/23 0849 01/11/24 1058   ITP Comments Initial phone call completed. Diagnosis can be found in [facility] 6/23. EP Orientation scheduled for Thursday 7/31 at 8am. Completed and gym orientation for cardiac rehab. Initial ITP created and sent for review to Dr. Oneil Pinal, Medical Director. First full day of exercise!  Patient was oriented to gym and equipment including functions, settings, policies, and procedures.  Patient's individual exercise prescription and treatment plan were reviewed.  All starting workloads were established based on the results of the 6 minute walk test done at initial orientation visit.  The plan for exercise progression was also introduced and progression will be customized based on patient's performance and goals. 30 Day review  completed. Medical Director ITP review done; changes made as directed and signed approval by Medical Director. New to program. 30 Day review completed. Medical Director ITP review done, changes made as directed, and signed approval by Medical Director.    Row Name 02/08/24 0944           ITP Comments 30 Day review completed. Medical Director ITP review done, changes made as directed, and signed approval by Medical Director.          Comments: 30 day review

## 2024-02-09 ENCOUNTER — Encounter

## 2024-02-10 ENCOUNTER — Encounter: Admitting: Emergency Medicine

## 2024-02-10 DIAGNOSIS — Z955 Presence of coronary angioplasty implant and graft: Secondary | ICD-10-CM | POA: Diagnosis not present

## 2024-02-10 NOTE — Progress Notes (Signed)
 Daily Session Note  Patient Details  Name: Randy Ingram MRN: [id number] Date of Birth: 10/25/1955 Referring Provider:   Flowsheet Row Cardiac Rehab from 11/17/2023 in [facility] Cardiac and Pulmonary Rehab  Referring Provider Dr. Murray Khan, MD    Encounter Date: 02/10/2024  Check In:  Session Check In - 02/10/24 0733       Check-In   Supervising physician immediately available to respond to emergencies See telemetry face sheet for immediately available ER MD    Location [facility]-Cardiac & Pulmonary Rehab    Staff Present Devaughn Jaeger, BS, Exercise Physiologist;Jamesia Linnen RN,BSN;Joseph Rolinda RCP,RRT,BSRT    Virtual Visit No    Medication changes reported     No    Fall or balance concerns reported    No    Warm-up and Cool-down Performed on first and last piece of equipment    Resistance Training Performed Yes    VAD Patient? No    PAD/SET Patient? No      Pain Assessment   Currently in Pain? No/denies             Social History   Tobacco Use  Smoking Status Never  Smokeless Tobacco Never    Goals Met:  Independence with exercise equipment Exercise tolerated well No report of concerns or symptoms today Strength training completed today  Goals Unmet:  Not Applicable  Comments: Pt able to follow exercise prescription today without complaint.  Will continue to monitor for progression.    Dr. Oneil Pinal is Medical Director for [facility] Cardiac Rehabilitation.  Dr. Fuad Aleskerov is Medical Director for [facility] Pulmonary Rehabilitation.

## 2024-02-14 ENCOUNTER — Encounter

## 2024-02-14 DIAGNOSIS — Z955 Presence of coronary angioplasty implant and graft: Secondary | ICD-10-CM

## 2024-02-14 NOTE — Progress Notes (Signed)
 Daily Session Note  Patient Details  Name: NOBUO NUNZIATA MRN: [id number] Date of Birth: [birth year] Referring Provider:   Flowsheet Row Cardiac Rehab from 11/17/2023 in [facility] Cardiac and Pulmonary Rehab  Referring Provider Dr. Murray Khan, MD    Encounter Date: 02/14/2024  Check In:  Session Check In - 02/14/24 0745       Check-In   Supervising physician immediately available to respond to emergencies See telemetry face sheet for immediately available ER MD    Location [facility]-Cardiac & Pulmonary Rehab    Staff Present Burnard Davenport RN,BSN,MPA;Margaret Best, MS, Exercise Physiologist;Jason Elnor [facility]    Virtual Visit No    Medication changes reported     No    Fall or balance concerns reported    No    Warm-up and Cool-down Performed on first and last piece of equipment    Resistance Training Performed Yes    VAD Patient? No    PAD/SET Patient? No      Pain Assessment   Currently in Pain? No/denies             Social History   Tobacco Use  Smoking Status Never  Smokeless Tobacco Never    Goals Met:  Independence with exercise equipment Exercise tolerated well No report of concerns or symptoms today Strength training completed today  Goals Unmet:  Not Applicable  Comments: Pt able to follow exercise prescription today without complaint.  Will continue to monitor for progression.    Dr. Oneil Pinal is Medical Director for [facility] Cardiac Rehabilitation.  Dr. Fuad Aleskerov is Medical Director for [facility] Pulmonary Rehabilitation.

## 2024-02-16 ENCOUNTER — Encounter: Admitting: Emergency Medicine

## 2024-02-16 DIAGNOSIS — Z955 Presence of coronary angioplasty implant and graft: Secondary | ICD-10-CM

## 2024-02-16 NOTE — Progress Notes (Signed)
 Daily Session Note  Patient Details  Name: Randy Ingram MRN: [id number] Date of Birth: November 21, 1955 Referring Provider:   Flowsheet Row Card[facility] Re[facility] Cardiac [facility]  Referring Provider Dr. Murray Khan, MD    Encounter Date: 02/16/2024  Check In:  Session Check In - 02/16/24 0739       Check-In   Supervising physician immediately available to respond to emergencies See telemetry face sheet for immediately available ER MD    Location [facility]-Cardiac & Pulmonary Rehab    Staff Present Fairy Plater RCP,RRT,BSRT;Duwan Adrian RN,BSN;Margaret Best, MS, Exercise Physiologist;Jason Elnor RDN,LDN    Virtual Visit No    Medication changes reported     No    Fall or balance concerns reported    No    Warm-up and Cool-down Performed on first and last piece of equipment    Resistance Training Performed Yes    VAD Patient? No    PAD/SET Patient? No      Pain Assessment   Currently in Pain? No/denies             Social History   Tobacco Use  Smoking Status Never  Smokeless Tobacco Never    Goals Met:  Independence with exercise equipment Exercise tolerated well No report of concerns or symptoms today Strength training completed today  Goals Unmet:  Not Applicable  Comments: Pt able to follow exercise prescription today without complaint.  Will continue to monitor for progression.    Dr. Oneil Pinal is Medical Director for [facility] Cardiac Rehabilitation.  Dr. Fuad Aleskerov is Medical Director for [facility] Pulmonary Rehabilitation.

## 2024-02-17 ENCOUNTER — Encounter: Admitting: Emergency Medicine

## 2024-02-17 DIAGNOSIS — Z955 Presence of coronary angioplasty implant and graft: Secondary | ICD-10-CM | POA: Diagnosis not present

## 2024-02-17 NOTE — Progress Notes (Signed)
 Daily Session Note  Patient Details  Name: Randy Ingram MRN: [id number] Date of Birth: [birth year] Referring Provider:   Flowsheet Row Cardiac Rehab from 11/17/2023 in [facility] Cardiac and Pulmonary Rehab  Referring Provider Dr. Murray Khan, MD    Encounter Date: 02/17/2024  Check In:  Session Check In - 02/17/24 0746       Check-In   Supervising physician immediately available to respond to emergencies See telemetry face sheet for immediately available ER MD    Location [facility]-Cardiac & Pulmonary Rehab    Staff Present Devaughn Jaeger, BS, Exercise Physiologist;Adynn Caseres RN,BSN;Joseph Rolinda RCP,RRT,BSRT    Virtual Visit No    Medication changes reported     No    Fall or balance concerns reported    No    Warm-up and Cool-down Performed on first and last piece of equipment    Resistance Training Performed Yes    VAD Patient? No    PAD/SET Patient? No      Pain Assessment   Currently in Pain? No/denies             Social History   Tobacco Use  Smoking Status Never  Smokeless Tobacco Never    Goals Met:  Independence with exercise equipment Exercise tolerated well No report of concerns or symptoms today Strength training completed today  Goals Unmet:  Not Applicable  Comments: Pt able to follow exercise prescription today without complaint.  Will continue to monitor for progression.    Dr. Oneil Pinal is Medical Director for [facility] Cardiac Rehabilitation.  Dr. Fuad Aleskerov is Medical Director for [facility] Pulmonary Rehabilitation.

## 2024-02-21 ENCOUNTER — Encounter: Attending: Internal Medicine

## 2024-02-21 DIAGNOSIS — Z955 Presence of coronary angioplasty implant and graft: Secondary | ICD-10-CM | POA: Insufficient documentation

## 2024-02-21 DIAGNOSIS — Z48812 Encounter for surgical aftercare following surgery on the circulatory system: Secondary | ICD-10-CM | POA: Insufficient documentation

## 2024-02-23 ENCOUNTER — Encounter: Admitting: Emergency Medicine

## 2024-02-23 DIAGNOSIS — Z48812 Encounter for surgical aftercare following surgery on the circulatory system: Secondary | ICD-10-CM | POA: Diagnosis not present

## 2024-02-23 DIAGNOSIS — Z955 Presence of coronary angioplasty implant and graft: Secondary | ICD-10-CM | POA: Diagnosis present

## 2024-02-23 NOTE — Progress Notes (Signed)
 Daily Session Note  Patient Details  Name: Randy Ingram MRN: [id number] Date of Birth: March 28, 1956 Referring Provider:   Flowsheet Row Cardiac Rehab from 11/17/2023 in [facility] Cardiac and Pulmonary Rehab  Referring Provider Dr. Murray Khan, MD    Encounter Date: 02/23/2024  Check In:  Session Check In - 02/23/24 0737       Check-In   Supervising physician immediately available to respond to emergencies See telemetry face sheet for immediately available ER MD    Location [facility]-Cardiac & Pulmonary Rehab    Staff Present Leita Franks RN,BSN;[facility] [place], [place], Exercise Physiologist    Virtual Visit No    Medication changes reported     No    Fall or balance concerns reported    No    Warm-up and Cool-down Performed on first and last piece of equipment    Resistance Training Performed Yes    VAD Patient? No    PAD/SET Patient? No      Pain Assessment   Currently in Pain? No/denies             Social History   Tobacco Use  Smoking Status Never  Smokeless Tobacco Never    Goals Met:  Independence with exercise equipment Exercise tolerated well No report of concerns or symptoms today Strength training completed today  Goals Unmet:  Not Applicable  Comments: Pt able to follow exercise prescription today without complaint.  Will continue to monitor for progression.    Dr. Oneil Pinal is Medical Director for [facility] Cardiac Rehabilitation.  Dr. Fuad Aleskerov is Medical Director for [facility] Pulmonary Rehabilitation.

## 2024-02-24 ENCOUNTER — Encounter: Admitting: Emergency Medicine

## 2024-02-24 DIAGNOSIS — Z955 Presence of coronary angioplasty implant and graft: Secondary | ICD-10-CM | POA: Diagnosis not present

## 2024-02-24 NOTE — Progress Notes (Signed)
 Daily Session Note  Patient Details  Name: Randy Ingram MRN: [id number] Date of Birth: Feb 05, 1956 Referring Provider:   Flowsheet Row Cardiac Rehab from 11/17/2023 in [facility] Cardiac and Pulmonary Rehab  Referring Provider Dr. Murray Khan, MD    Encounter Date: 02/24/2024  Check In:  Session Check In - 02/24/24 0736       Check-In   Supervising physician immediately available to respond to emergencies See telemetry face sheet for immediately available ER MD    Location [facility]-Cardiac & Pulmonary Rehab    Staff Present Leita Franks RN,BSN;[facility] [place], [place], Exercise Physiologist    Virtual Visit No    Medication changes reported     No    Fall or balance concerns reported    No    Warm-up and Cool-down Performed on first and last piece of equipment    Resistance Training Performed Yes    VAD Patient? No    PAD/SET Patient? No      Pain Assessment   Currently in Pain? No/denies             Social History   Tobacco Use  Smoking Status Never  Smokeless Tobacco Never    Goals Met:  Independence with exercise equipment Exercise tolerated well No report of concerns or symptoms today Strength training completed today  Goals Unmet:  Not Applicable  Comments: Pt able to follow exercise prescription today without complaint.  Will continue to monitor for progression.    Dr. Oneil Pinal is Medical Director for [facility] Cardiac Rehabilitation.  Dr. Fuad Aleskerov is Medical Director for [facility] Pulmonary Rehabilitation.

## 2024-02-28 ENCOUNTER — Encounter

## 2024-02-28 DIAGNOSIS — Z955 Presence of coronary angioplasty implant and graft: Secondary | ICD-10-CM | POA: Diagnosis not present

## 2024-02-28 NOTE — Progress Notes (Signed)
 Daily Session Note  Patient Details  Name: Randy Ingram MRN: [id number] Date of Birth: January 29, 1956 Referring Provider:   Flowsheet Row Cardiac Rehab from 11/17/2023 in [facility] Cardiac and Pulmonary Rehab  Referring Provider Dr. Murray Khan, MD    Encounter Date: 02/28/2024  Check In:  Session Check In - 02/28/24 0743       Check-In   Supervising physician immediately available to respond to emergencies See telemetry face sheet for immediately available ER MD    Location [facility]-Cardiac & Pulmonary Rehab    Staff Present Selinda Pereyra RDN,LDN;Noah Tickle, BS, Exercise Physiologist;Margaret Best, MS, Exercise Physiologist;Malikiah Debarr RN,BSN,MPA;Mary Godley, RN, DNP, NE-BC    Virtual Visit No    Medication changes reported     No    Fall or balance concerns reported    No    Warm-up and Cool-down Performed on first and last piece of equipment    Resistance Training Performed Yes    VAD Patient? No    PAD/SET Patient? No      Pain Assessment   Currently in Pain? No/denies             Social History   Tobacco Use  Smoking Status Never  Smokeless Tobacco Never    Goals Met:  Independence with exercise equipment Exercise tolerated well No report of concerns or symptoms today Strength training completed today  Goals Unmet:  Not Applicable  Comments: Pt able to follow exercise prescription today without complaint.  Will continue to monitor for progression.    Dr. Oneil Pinal is Medical Director for [facility] Cardiac Rehabilitation.  Dr. Fuad Aleskerov is Medical Director for [facility] Pulmonary Rehabilitation.

## 2024-03-01 ENCOUNTER — Encounter: Admitting: Emergency Medicine

## 2024-03-01 DIAGNOSIS — Z955 Presence of coronary angioplasty implant and graft: Secondary | ICD-10-CM

## 2024-03-01 NOTE — Progress Notes (Signed)
 Daily Session Note  Patient Details  Name: Randy Ingram MRN: [id number] Date of Birth: 05/30/1955 Referring Provider:   Flowsheet Row Cardiac Rehab from 11/17/2023 in [facility] Cardiac and Pulmonary Rehab  Referring Provider Dr. Murray Khan, MD    Encounter Date: 03/01/2024  Check In:  Session Check In - 03/01/24 0746       Check-In   Supervising physician immediately available to respond to emergencies See telemetry face sheet for immediately available ER MD    Location [facility]-Cardiac & Pulmonary Rehab    Staff Present Leita Franks RN,BSN;[facility] RCP,RRT,BSRT;Noah Tickle, [place], Exercise Physiologist;Jason Elnor RDN,LDN    Virtual Visit No    Medication changes reported     No    Fall or balance concerns reported    No    Warm-up and Cool-down Performed on first and last piece of equipment    Resistance Training Performed Yes    VAD Patient? No    PAD/SET Patient? No      Pain Assessment   Currently in Pain? No/denies             Social History   Tobacco Use  Smoking Status Never  Smokeless Tobacco Never    Goals Met:  Independence with exercise equipment Exercise tolerated well No report of concerns or symptoms today Strength training completed today  Goals Unmet:  Not Applicable  Comments: Pt able to follow exercise prescription today without complaint.  Will continue to monitor for progression.    Dr. Oneil Pinal is Medical Director for [facility] Cardiac Rehabilitation.  Dr. Fuad Aleskerov is Medical Director for [facility] Pulmonary Rehabilitation.

## 2024-03-02 ENCOUNTER — Encounter

## 2024-03-06 ENCOUNTER — Encounter

## 2024-03-06 DIAGNOSIS — Z955 Presence of coronary angioplasty implant and graft: Secondary | ICD-10-CM

## 2024-03-06 NOTE — Progress Notes (Signed)
 Daily Session Note  Patient Details  Name: Randy Ingram MRN: [id number] Date of Birth: 26-Jan-1956 Referring Provider:   Flowsheet Row Cardiac Rehab from 11/17/2023 in [facility] Cardiac and Pulmonary Rehab  Referring Provider Dr. Murray Khan, MD    Encounter Date: 03/06/2024  Check In:  Session Check In - 03/06/24 0736       Check-In   Supervising physician immediately available to respond to emergencies See telemetry face sheet for immediately available ER MD    Location [facility]-Cardiac & Pulmonary Rehab    Staff Present Selinda Pereyra RDN,LDN;Margaret Best, MS, Exercise Physiologist;Mary Peggi, RN, DNP, NE-BC;Sevilla Murtagh RN,BSN,MPA    Virtual Visit No    Medication changes reported     No    Fall or balance concerns reported    No    Warm-up and Cool-down Performed on first and last piece of equipment    Resistance Training Performed Yes    VAD Patient? No    PAD/SET Patient? No      Pain Assessment   Currently in Pain? No/denies             Social History   Tobacco Use  Smoking Status Never  Smokeless Tobacco Never    Goals Met:  Independence with exercise equipment Exercise tolerated well No report of concerns or symptoms today Strength training completed today  Goals Unmet:  Not Applicable  Comments: Pt able to follow exercise prescription today without complaint.  Will continue to monitor for progression.    Dr. Oneil Pinal is Medical Director for [facility] Cardiac Rehabilitation.  Dr. Fuad Aleskerov is Medical Director for [facility] Pulmonary Rehabilitation.

## 2024-03-07 ENCOUNTER — Encounter: Payer: Self-pay | Admitting: *Deleted

## 2024-03-07 DIAGNOSIS — Z955 Presence of coronary angioplasty implant and graft: Secondary | ICD-10-CM

## 2024-03-07 NOTE — Progress Notes (Signed)
 Cardiac Individual Treatment Plan  Patient Details  Name: Randy Ingram MRN: [id number] Date of Birth: [birth year] Referring Provider:   Flowsheet Row Cardiac Rehab from 11/17/2023 in [facility] Cardiac and Pulmonary Rehab  Referring Provider Dr. Murray Khan, MD    Initial Encounter Date:  Flowsheet Row Cardiac Rehab from 11/17/2023 in [facility] Cardiac and Pulmonary Rehab  Date 11/17/23    Visit Diagnosis: Status post coronary artery stent placement  Patient's Home Medications on Admission:  Current Outpatient Medications:    amLODipine  (NORVASC ) 10 MG tablet, Take 1 tablet (10 mg total) by mouth daily., Disp: 90 tablet, Rfl: 3   aspirin  EC 81 MG tablet, Take 81 mg by mouth in the morning. Swallow whole., Disp: , Rfl:    B COMPLEX VITAMINS PO, Take 1 tablet by mouth in the morning., Disp: , Rfl:    chlorthalidone  (HYGROTON ) 50 MG tablet, Take 50 mg by mouth in the morning., Disp: , Rfl:    cholecalciferol (VITAMIN D3) 25 MCG (1000 UNIT) tablet, Take 1,000 Units by mouth in the morning., Disp: , Rfl:    clopidogrel  (PLAVIX ) 75 MG tablet, Take 1 tablet (75 mg total) by mouth daily., Disp: 60 tablet, Rfl: 0   clopidogrel  (PLAVIX ) 75 MG tablet, Take 1 tablet (75 mg total) by mouth daily., Disp: 60 tablet, Rfl: 0   isosorbide  mononitrate (IMDUR ) 30 MG 24 hr tablet, Take 1 tablet (30 mg total) by mouth daily., Disp: 30 tablet, Rfl: 0   isosorbide  mononitrate (IMDUR ) 60 MG 24 hr tablet, Take 60 mg by mouth daily., Disp: , Rfl:    Krill Oil 1000 MG CAPS, Take 1,000 mg by mouth in the morning., Disp: , Rfl:    losartan  (COZAAR ) 100 MG tablet, Take 1 tablet (100 mg total) by mouth daily., Disp: 90 tablet, Rfl: 0   metoprolol succinate (TOPROL-XL) 25 MG 24 hr tablet, Take 25 mg by mouth in the morning., Disp: , Rfl:    OVER THE COUNTER MEDICATION, Take 1 Scoop by mouth in the morning. Oligomeric proanthocyanidins (OPC), Disp: , Rfl:    Potassium 99 MG TABS, Take 99 mg by mouth in the morning., Disp: , Rfl:     Resveratrol 100 MG CAPS, Take 100 mg by mouth in the morning., Disp: , Rfl:    rosuvastatin  (CRESTOR ) 40 MG tablet, Take 40 mg by mouth every evening., Disp: , Rfl:   Past Medical History: Past Medical History:  Diagnosis Date   Hypertension    controlled on meds    Tobacco Use: Social History   Tobacco Use  Smoking Status Never  Smokeless Tobacco Never    Labs: Review Flowsheet  More data exists      Latest Ref Rng & Units 10/28/2016 11/09/2017 11/29/2018 01/01/2020 02/20/2021  Labs for ITP Cardiac and Pulmonary Rehab  Cholestrol 100 - 199 mg/dL 803  799  801  802  814   LDL (calc) 0 - 99 mg/dL 878  869  871  880  887   HDL-C >39 mg/dL 46  47  45  46  46   Trlycerides 0 - 149 mg/dL 856  883  876  818  845   Hemoglobin A1c 4.8 - 5.6 % - - - - 5.5      Exercise Target Goals: Exercise Program Goal: Individual exercise prescription set using results from initial 6 min walk test and THRR while considering  patient's activity barriers and safety.   Exercise Prescription Goal: Initial exercise prescription builds to 30-45  minutes a day of aerobic activity, 2-3 days per week.  Home exercise guidelines will be given to patient during program as part of exercise prescription that the participant will acknowledge.   Education: Aerobic Exercise: - Group verbal and visual presentation on the components of exercise prescription. Introduces F.I.T.T principle from ACSM for exercise prescriptions.  Reviews F.I.T.T. principles of aerobic exercise including progression. Written material provided at class time. Flowsheet Row Cardiac Rehab from 11/17/2023 in [facility] Cardiac and Pulmonary Rehab  Education need identified 11/17/23    Education: Resistance Exercise: - Group verbal and visual presentation on the components of exercise prescription. Introduces F.I.T.T principle from ACSM for exercise prescriptions  Reviews F.I.T.T. principles of resistance exercise including progression. Written  material provided at class time.    Education: Exercise & Equipment Safety: - Individual verbal instruction and demonstration of equipment use and safety with use of the equipment. Flowsheet Row Cardiac Rehab from 11/17/2023 in [facility] Cardiac and Pulmonary Rehab  Date 11/17/23  Educator NT  Instruction Review Code 1- Verbalizes Understanding    Education: Exercise Physiology & General Exercise Guidelines: - Group verbal and written instruction with models to review the exercise physiology of the cardiovascular system and associated critical values. Provides general exercise guidelines with specific guidelines to those with heart or lung disease. Written material provided at class time.   Education: Flexibility, Balance, Mind/Body Relaxation: - Group verbal and visual presentation with interactive activity on the components of exercise prescription. Introduces F.I.T.T principle from ACSM for exercise prescriptions. Reviews F.I.T.T. principles of flexibility and balance exercise training including progression. Also discusses the mind body connection.  Reviews various relaxation techniques to help reduce and manage stress (i.e. Deep breathing, progressive muscle relaxation, and visualization). Balance handout provided to take home. Written material provided at class time.   Activity Barriers & Risk Stratification:  Activity Barriers & Cardiac Risk Stratification - 11/11/23 1402       Activity Barriers & Cardiac Risk Stratification   Activity Barriers Other (comment)    Comments left foot plantar fascitis    Cardiac Risk Stratification Moderate          6 Minute Walk:  6 Minute Walk     Row Name 11/17/23 0916         6 Minute Walk   Phase Initial     Distance 1500 feet     Walk Time 6 minutes     # of Rest Breaks 0     MPH 2.84     METS 3.37     RPE 11     Perceived Dyspnea  0     VO2 Peak 11.8     Symptoms No     Resting HR 59 bpm     Resting BP 126/74     Resting Oxygen  Saturation  96 %     Exercise Oxygen Saturation  during 6 min walk 94 %     Max Ex. HR 98 bpm     Max Ex. BP 144/72     2 Minute Post BP 128/70        Oxygen Initial Assessment:   Oxygen Re-Evaluation:   Oxygen Discharge (Final Oxygen Re-Evaluation):   Initial Exercise Prescription:  Initial Exercise Prescription - 11/17/23 0900       Date of Initial Exercise RX and Referring Provider   Date 11/17/23    Referring Provider Dr. Murray Khan, MD      Oxygen   Maintain Oxygen Saturation 88% or higher  Treadmill   MPH 2.8    Grade 1    Minutes 15    METs 3.53      REL-XR   Level 3    Speed 50    Minutes 15    METs 3.37      Rower   Level 4    Watts 35    Minutes 15    METs 3.37      Prescription Details   Frequency (times per week) 3    Duration Progress to 30 minutes of continuous aerobic without signs/symptoms of physical distress      Intensity   THRR 40-80% of Max Heartrate 97-135    Ratings of Perceived Exertion 11-13    Perceived Dyspnea 0-4      Progression   Progression Continue to progress workloads to maintain intensity without signs/symptoms of physical distress.      Resistance Training   Training Prescription Yes    Weight 7 lb    Reps 10-15          Perform Capillary Blood Glucose checks as needed.  Exercise Prescription Changes:   Exercise Prescription Changes     Row Name 11/17/23 0900 12/08/23 0900 12/22/23 1600 01/17/24 0800 01/18/24 1000     Response to Exercise   Blood Pressure (Admit) 126/74 118/62 126/62 -- 118/56   Blood Pressure (Exercise) 144/72 160/82 168/82 -- 150/70   Blood Pressure (Exit) 128/70 102/62 114/62 -- 114/64   Heart Rate (Admit) 59 bpm 60 bpm 66 bpm -- 63 bpm   Heart Rate (Exercise) 98 bpm 140 bpm 131 bpm -- 140 bpm   Heart Rate (Exit) 69 bpm 75 bpm 80 bpm -- 80 bpm   Oxygen Saturation (Admit) 96 % -- -- -- --   Oxygen Saturation (Exercise) 94 % -- -- -- --   Rating of Perceived Exertion  (Exercise) 11 14 14  -- 14   Perceived Dyspnea (Exercise) 0 0 0 -- --   Symptoms none none none -- none   Comments Results first 2 weeks of exercise -- -- --   Duration -- Progress to 30 minutes of  aerobic without signs/symptoms of physical distress Progress to 30 minutes of  aerobic without signs/symptoms of physical distress -- Continue with 30 min of aerobic exercise without signs/symptoms of physical distress.   Intensity -- THRR unchanged THRR unchanged -- THRR unchanged     Progression   Progression -- Continue to progress workloads to maintain intensity without signs/symptoms of physical distress. Continue to progress workloads to maintain intensity without signs/symptoms of physical distress. -- Continue to progress workloads to maintain intensity without signs/symptoms of physical distress.   Average METs -- 4.72 5.46 -- 5.26     Resistance Training   Training Prescription -- Yes Yes -- Yes   Weight -- 7 lb 7 lb -- 7 lb   Reps -- 10-15 10-15 -- 10-15     Interval Training   Interval Training -- No No -- No     Treadmill   MPH -- 3.1 3.2 -- 3.2   Grade -- 2.5 3 -- 2.5   Minutes -- 15 15 -- 15   METs -- 4.44 4.77 -- 4.55     REL-XR   Level -- 10 8 -- 10   Minutes -- 15 15 -- 15   METs -- 7.8 7.5 -- 7.2     Rower   Level -- 10 10 -- 5   Watts -- 60 56 --  71   Minutes -- 15 15 -- 15   METs -- 4.8 4.81 -- 6.11     Home Exercise Plan   Plans to continue exercise at -- -- -- [employer] (comment)  Plans to join [employer] for aerobic machines [employer] (comment)  Plans to join [employer] for aerobic machines   Frequency -- -- -- Add 2 additional days to program exercise sessions. Add 2 additional days to program exercise sessions.   Initial Home Exercises Provided -- -- -- 01/17/24 01/17/24     Oxygen   Maintain Oxygen Saturation -- 88% or higher 88% or higher 88% or higher 88% or higher    Row Name 02/03/24 1000 02/16/24 0900 03/01/24 1000          Response to Exercise   Blood Pressure (Admit) 114/60 126/62 124/62     Blood Pressure (Exit) 120/72 108/60 100/60     Heart Rate (Admit) 65 bpm 61 bpm 67 bpm     Heart Rate (Exercise) 144 bpm 142 bpm 131 bpm     Heart Rate (Exit) 92 bpm 82 bpm 91 bpm     Rating of Perceived Exertion (Exercise) 16 17 17      Symptoms none none none     Duration Continue with 30 min of aerobic exercise without signs/symptoms of physical distress. Continue with 30 min of aerobic exercise without signs/symptoms of physical distress. Continue with 30 min of aerobic exercise without signs/symptoms of physical distress.     Intensity THRR unchanged THRR unchanged THRR unchanged       Progression   Progression Continue to progress workloads to maintain intensity without signs/symptoms of physical distress. Continue to progress workloads to maintain intensity without signs/symptoms of physical distress. Continue to progress workloads to maintain intensity without signs/symptoms of physical distress.     Average METs 6.3 6.2 7.1       Resistance Training   Training Prescription Yes Yes Yes     Weight 7 lb 7 lb 7 lb     Reps 10-15 10-15 10-15       Interval Training   Interval Training No No No       Treadmill   MPH 3.2 3.2 3.2     Grade 3.5 6.5 12     Minutes 15 15 15      METs 4.99 6.32 8.74       REL-XR   Level 10 9 11      Minutes 15 15 15      METs 8.1 6.8 8.2       Rower   Level 10 10 10      Watts 91 83 76     Minutes 15 15 15      METs 6.13 6.6 6.39       Home Exercise Plan   Plans to continue exercise at [employer] (comment)  Plans to join [employer] [facility] (comment)  Plans to join [employer] [facility] (comment)  Plans to join [employer] for aerobic machines     Frequency Add 2 additional days to program exercise sessions. Add 2 additional days to program exercise sessions. Add 2 additional days to program  exercise sessions.     Initial Home Exercises Provided 01/17/24 01/17/24 01/17/24       Oxygen   Maintain Oxygen Saturation 88% or higher 88% or higher 88% or higher        Exercise Comments:   Exercise Comments  Row Name 11/22/23 [phone]           Exercise Comments First full day of exercise!  Patient was oriented to gym and equipment including functions, settings, policies, and procedures.  Patient's individual exercise prescription and treatment plan were reviewed.  All starting workloads were established based on the results of the 6 minute walk test done at initial orientation visit.  The plan for exercise progression was also introduced and progression will be customized based on patient's performance and goals.          Exercise Goals and Review:   Exercise Goals     Row Name 11/17/23 0917             Exercise Goals   Increase Physical Activity Yes       Intervention Provide advice, education, support and counseling about physical activity/exercise needs.;Develop an individualized exercise prescription for aerobic and resistive training based on initial evaluation findings, risk stratification, comorbidities and participant's personal goals.       Expected Outcomes Short Term: Attend rehab on a regular basis to increase amount of physical activity.;Long Term: Add in home exercise to make exercise part of routine and to increase amount of physical activity.;Long Term: Exercising regularly at least 3-5 days a week.       Increase Strength and Stamina Yes       Intervention Provide advice, education, support and counseling about physical activity/exercise needs.;Develop an individualized exercise prescription for aerobic and resistive training based on initial evaluation findings, risk stratification, comorbidities and participant's personal goals.       Expected Outcomes Long Term: Improve cardiorespiratory fitness, muscular endurance and strength as measured by increased  METs and functional capacity ( );Short Term: Increase workloads from initial exercise prescription for resistance, speed, and METs.;Short Term: Perform resistance training exercises routinely during rehab and add in resistance training at home       Able to understand and use rate of perceived exertion (RPE) scale Yes       Intervention Provide education and explanation on how to use RPE scale       Expected Outcomes Short Term: Able to use RPE daily in rehab to express subjective intensity level;Long Term:  Able to use RPE to guide intensity level when exercising independently       Able to understand and use Dyspnea scale Yes       Intervention Provide education and explanation on how to use Dyspnea scale       Expected Outcomes Short Term: Able to use Dyspnea scale daily in rehab to express subjective sense of shortness of breath during exertion;Long Term: Able to use Dyspnea scale to guide intensity level when exercising independently       Knowledge and understanding of Target Heart Rate Range (THRR) Yes       Intervention Provide education and explanation of THRR including how the numbers were predicted and where they are located for reference       Expected Outcomes Short Term: Able to state/look up THRR;Long Term: Able to use THRR to govern intensity when exercising independently;Short Term: Able to use daily as guideline for intensity in rehab       Able to check pulse independently Yes       Intervention Review the importance of being able to check your own pulse for safety during independent exercise;Provide education and demonstration on how to check pulse in carotid and radial arteries.       Expected Outcomes Short Term:  Able to explain why pulse checking is important during independent exercise;Long Term: Able to check pulse independently and accurately       Understanding of Exercise Prescription Yes       Intervention Provide education, explanation, and written materials on  patient's individual exercise prescription       Expected Outcomes Short Term: Able to explain program exercise prescription;Long Term: Able to explain home exercise prescription to exercise independently          Exercise Goals Re-Evaluation :  Exercise Goals Re-Evaluation     Row Name 11/22/23 0735 12/08/23 0936 12/22/23 1617 01/05/24 0800 01/05/24 1403     Exercise Goal Re-Evaluation   Exercise Goals Review Increase Physical Activity;Able to understand and use rate of perceived exertion (RPE) scale;Knowledge and understanding of Target Heart Rate Range (THRR);Understanding of Exercise Prescription;Increase Strength and Stamina;Able to understand and use Dyspnea scale;Able to check pulse independently Increase Physical Activity;Increase Strength and Stamina;Understanding of Exercise Prescription Increase Physical Activity;Increase Strength and Stamina;Understanding of Exercise Prescription Increase Physical Activity;Increase Strength and Stamina;Understanding of Exercise Prescription Increase Physical Activity;Increase Strength and Stamina;Understanding of Exercise Prescription   Comments Reviewed RPE and dyspnea scale, THR and program prescription with pt today.  Pt voiced understanding and was given a copy of goals to take home. Munir is off to a good start in the program, and was able to attend his first few sessions during this review period. During these sessions he was able to increase his workload on the treadmill to a speed of 3. and an incline of 2.5%. He was also able to increase from level 3 to 10 on the XR. We will continue to monitor his progress in the program. Luay is doing well in rehab. He was recently able to increase his workload on the treadmill to a speed of 3.2mph and 3% incline. He was able to maintain level 10 on the rowing machine. We will continue to monitor his progress in the program. Thurmon is doing well at rehab. He says he works hard at home. Not intentional  exercise, but is active every day. Encouraged him to add more intentional exercise into his home routine Mandrell did not attend rehab during the last review period. He did return to the program on 01/03/2024. We will continue to monitor his progress in the program.   Expected Outcomes Short: Use RPE daily to regulate intensity. Long: Follow program prescription in THR. Short: Continue to follow exercise prescription. Long: Continue exercise to improve strength and stamina. Short: Continue to increase treadmill workloads. Long: Continue exercise to improve strength and stamina. STg: Look to add more intentional exercise. LTG: Continue exercise to improve strength and stamina. Short: Return to consistent attendance in rehab. Long: Graduate.    Row Name 01/17/24 9193 01/18/24 1004 02/03/24 1040 02/16/24 0955 03/01/24 1052     Exercise Goal Re-Evaluation   Exercise Goals Review Increase Strength and Stamina;Understanding of Exercise Prescription;Able to understand and use Dyspnea scale;Increase Physical Activity;Knowledge and understanding of Target Heart Rate Range (THRR);Able to check pulse independently;Able to understand and use rate of perceived exertion (RPE) scale Increase Physical Activity;Increase Strength and Stamina;Understanding of Exercise Prescription Increase Physical Activity;Increase Strength and Stamina;Understanding of Exercise Prescription Increase Physical Activity;Increase Strength and Stamina;Understanding of Exercise Prescription Increase Physical Activity;Increase Strength and Stamina;Understanding of Exercise Prescription   Comments Reviewed home exercise with pt today.  Pt plans to join [employer] for aerobic machines and resistance training.  Reviewed THR, pulse, RPE, sign and symptoms, pulse oximetery  and when to call 911 or MD.  Also discussed weather considerations and indoor options.  Pt voiced understanding. Harlis continues to do well in the program. He continues to walk the  treadmill at a speed of 3.2 mph with a decreased incline at 2.5%. He also improved back up to level 10 on the XR. We will continue to monitor his progress in the program. Jareb is doing well in rehab. He has been able to increase his treadmill incline from 2.5 to 3.5%, as well as increase from level 5 to 10 on the rowing machine. We will continue to monitor his progress in the program. Jarmaine is doing well in rehab. He has been able to increase his treadmill workload by increasing his incline from 4.5 to 6.5% while maintaining his speed at 3.2 mph. He also continues to do well at level 10 on the rowing machine. We will continue to monitor his progress in the program. Norm is doing well in rehab. He is due for his post soon and hopes to improve. He increased his workload on the treadmill to a speed of 3.2 mph and incline of 12%. He also increased to level 11 on the XR. He worked at level 10 on the rower with an average watts of 76 watts. We will continue to monitor his progress in the program.   Expected Outcomes Short: Begin going to gym for exercise on days away from rehab. Long: Graduate and continue exercise independently. Short: Increase treadmill workload back up to previous incline. Long: Continue exercise to improve strength and stamina. Short: Continue to increase treadmill workload. Long: Continue exercise to improve strength and stamina. Short: Continue to progressively increase treadmill workload. Long: Continue exercise to improve strength and stamina. Short: Improve on post . Long: Continue to increase overall METs and stamina.      Discharge Exercise Prescription (Final Exercise Prescription Changes):  Exercise Prescription Changes - 03/01/24 1000       Response to Exercise   Blood Pressure (Admit) 124/62    Blood Pressure (Exit) 100/60    Heart Rate (Admit) 67 bpm    Heart Rate (Exercise) 131 bpm    Heart Rate (Exit) 91 bpm    Rating of Perceived Exertion (Exercise) 17     Symptoms none    Duration Continue with 30 min of aerobic exercise without signs/symptoms of physical distress.    Intensity THRR unchanged      Progression   Progression Continue to progress workloads to maintain intensity without signs/symptoms of physical distress.    Average METs 7.1      Resistance Training   Training Prescription Yes    Weight 7 lb    Reps 10-15      Interval Training   Interval Training No      Treadmill   MPH 3.2    Grade 12    Minutes 15    METs 8.74      REL-XR   Level 11    Minutes 15    METs 8.2      Rower   Level 10    Watts 76    Minutes 15    METs 6.39      Home Exercise Plan   Plans to continue exercise at [employer] (comment)   Plans to join [employer] for aerobic machines   Frequency Add 2 additional days to program exercise sessions.    Initial Home Exercises Provided 01/17/24      Oxygen  Maintain Oxygen Saturation 88% or higher          Nutrition:  Target Goals: Understanding of nutrition guidelines, daily intake of sodium 1500mg , cholesterol 200mg , calories 30% from fat and 7% or less from saturated fats, daily to have 5 or more servings of fruits and vegetables.  Education: Nutrition 1 -Group instruction provided by verbal, written material, interactive activities, discussions, models, and posters to present general guidelines for heart healthy nutrition including macronutrients, label reading, and promoting whole foods over processed counterparts. Education serves as pensions consultant of discussion of heart healthy eating for all. Written material provided at class time.    Education: Nutrition 2 -Group instruction provided by verbal, written material, interactive activities, discussions, models, and posters to present general guidelines for heart healthy nutrition including sodium, cholesterol, and saturated fat. Providing guidance of habit forming to improve blood pressure, cholesterol, and body weight. Written  material provided at class time.     Biometrics:  Pre Biometrics - 11/17/23 0918       Pre Biometrics   Height 5' 9.5 (1.765 m)    Weight 212 lb 1.6 oz (96.2 kg)    Waist Circumference 42 inches    Hip Circumference 40 inches    Waist to Hip Ratio 1.05 %    BMI (Calculated) 30.88    Single Leg Stand 30 seconds           Nutrition Therapy Plan and Nutrition Goals:  Nutrition Therapy & Goals - 11/17/23 0913       Nutrition Therapy   RD appointment deferred Yes      Intervention Plan   Intervention Prescribe, educate and counsel regarding individualized specific dietary modifications aiming towards targeted core components such as weight, hypertension, lipid management, diabetes, heart failure and other comorbidities.    Expected Outcomes Short Term Goal: Understand basic principles of dietary content, such as calories, fat, sodium, cholesterol and nutrients.;Short Term Goal: A plan has been developed with personal nutrition goals set during dietitian appointment.;Long Term Goal: Adherence to prescribed nutrition plan.          Nutrition Assessments:  MEDIFICTS Score Key: >=70 Need to make dietary changes  40-70 Heart Healthy Diet <= 40 Therapeutic Level Cholesterol Diet  Flowsheet Row Cardiac Rehab from 11/17/2023 in [facility]  Picture Your Plate Total Score on Admission 67   Picture Your Plate Scores: <59 Unhealthy dietary pattern with much room for improvement. 41-50 Dietary pattern unlikely to meet recommendations for good health and room for improvement. 51-60 More healthful dietary pattern, with some room for improvement.  >60 Healthy dietary pattern, although there may be some specific behaviors that could be improved.    Nutrition Goals Re-Evaluation:  Nutrition Goals Re-Evaluation     Row Name 01/05/24 0805 01/24/24 9187 02/16/24 0800         Goals   Comment RD appointment deferred. (Spoke with Danie about nutrition. He  report he is eating eggs and bacon most mornings. He drinks whole milk and eats whole yogurt. Stays away from carbs. Discussed the high saturated fat nature of bacon and whole dairy products. Educated on the importance of whole grains and not to avoid quality carbs. He says he has heard that before but is choosing to follow the advice of another cardiologist) RD appointment deferred. (Spoke with Jaquaveon about nutrition. He report he is eating eggs and bacon most mornings. He drinks whole milk and eats whole yogurt. Stays away from carbs. Discussed the high saturated  fat nature of bacon and whole dairy products. Educated on the importance of whole grains and not to avoid quality carbs. He says he has heard that before but is choosing to follow the advice of book he read by a surgeon) Deferred RD appointment        Nutrition Goals Discharge (Final Nutrition Goals Re-Evaluation):  Nutrition Goals Re-Evaluation - 02/16/24 0800       Goals   Comment Deferred RD appointment          Psychosocial: Target Goals: Acknowledge presence or absence of significant depression and/or stress, maximize coping skills, provide positive support system. Participant is able to verbalize types and ability to use techniques and skills needed for reducing stress and depression.   Education: Stress, Anxiety, and Depression - Group verbal and visual presentation to define topics covered.  Reviews how body is impacted by stress, anxiety, and depression.  Also discusses healthy ways to reduce stress and to treat/manage anxiety and depression. Written material provided at class time.   Education: Sleep Hygiene -Provides group verbal and written instruction about how sleep can affect your health.  Define sleep hygiene, discuss sleep cycles and impact of sleep habits. Review good sleep hygiene tips.   Initial Review & Psychosocial Screening:  Initial Psych Review & Screening - 11/11/23 1412       Initial Review    Current issues with None Identified      Family Dynamics   Good Support System? Yes   family     Barriers   Psychosocial barriers to participate in program There are no identifiable barriers or psychosocial needs.;The patient should benefit from training in stress management and relaxation.      Screening Interventions   Interventions To provide support and resources with identified psychosocial needs;Encouraged to exercise;Provide feedback about the scores to participant    Expected Outcomes Short Term goal: Utilizing psychosocial counselor, staff and physician to assist with identification of specific Stressors or current issues interfering with healing process. Setting desired goal for each stressor or current issue identified.;Long Term Goal: Stressors or current issues are controlled or eliminated.;Short Term goal: Identification and review with participant of any Quality of Life or Depression concerns found by scoring the questionnaire.;Long Term goal: The participant improves quality of Life and PHQ9 Scores as seen by post scores and/or verbalization of changes          Quality of Life Scores:   Quality of Life - 11/17/23 0911       Quality of Life   Select Quality of Life      Quality of Life Scores   Health/Function Pre 27.6 %    Socioeconomic Pre 27.38 %    Psych/Spiritual Pre 28.29 %    Family Pre 30 %    GLOBAL Pre 28.03 %         Scores of 19 and below usually indicate a poorer quality of life in these areas.  A difference of  2-3 points is a clinically meaningful difference.  A difference of 2-3 points in the total score of the Quality of Life Index has been associated with significant improvement in overall quality of life, self-image, physical symptoms, and general health in studies assessing change in quality of life.  PHQ-9: Review Flowsheet  More data exists      11/17/2023 08/18/2020 01/01/2020 11/27/2018 11/08/2017  Depression screen PHQ 2/9  Decreased Interest  0 0 0 0 0 0  Down, Depressed, Hopeless 0 0 0 0 0  PHQ - 2 Score 0 0 0 0 0 0  Altered sleeping 0 0 0 - -  Tired, decreased energy 0 0 0 - -  Change in appetite 0 0 0 - -  Feeling bad or failure about yourself  0 0 0 - -  Trouble concentrating 0 0 0 - -  Moving slowly or fidgety/restless 0 0 0 - -  Suicidal thoughts 0 0 0 - -  PHQ-9 Score 0  0  0  - -  Difficult doing work/chores - Not difficult at all Not difficult at all - -    Details       Data saved with a previous flowsheet row definition   Multiple values from one day are sorted in reverse-chronological order        Interpretation of Total Score  Total Score Depression Severity:  1-4 = Minimal depression, 5-9 = Mild depression, 10-14 = Moderate depression, 15-19 = Moderately severe depression, 20-27 = Severe depression   Psychosocial Evaluation and Intervention:  Psychosocial Evaluation - 11/11/23 1417       Psychosocial Evaluation & Interventions   Interventions Encouraged to exercise with the program and follow exercise prescription    Comments Mr. Radler is coming to cardiac rehab post stents. He states he has been feeling well and already back to work. He states he has no stress concerns at this time and sleeps very well. He is looking forward to attending the program to learn more about heart healthy living    Expected Outcomes Short: attend cardiac rehab for education and exercise Long: develop and maintain positive self care habits    Continue Psychosocial Services  Follow up required by staff          Psychosocial Re-Evaluation:  Psychosocial Re-Evaluation     Row Name 01/05/24 0802 01/24/24 0810 02/16/24 0801         Psychosocial Re-Evaluation   Current issues with None Identified None Identified None Identified     Comments Gaudencio denies any anxiety, depression or stress. Reports he sleeps really well. When asked how much sleep he gets he says not enough. only 6-7hrs, but he says he sleep well when he  is asleep. Loyce reports he sleeps well, 6-7hrs. he denies any anxiety, depression, and stress at this time. Patient reports no issues with their current mental states, sleep, stress, depression or anxiety. Will follow up with patient in a few weeks for any changes.     Expected Outcomes STG: Attend rehab more consisitently. LTG: Achieve and maintain a positive outlook on health and daily life STG: Attend rehab more consisitently. LTG: Achieve and maintain a positive outlook on health and daily life Short: Continue to exercise regularly to support mental health and notify staff of any changes. Long: maintain mental health and well being through teaching of rehab or prescribed medications independently.     Interventions Encouraged to attend Cardiac Rehabilitation for the exercise Encouraged to attend Cardiac Rehabilitation for the exercise Encouraged to attend Cardiac Rehabilitation for the exercise     Continue Psychosocial Services  Follow up required by staff Follow up required by staff Follow up required by staff        Psychosocial Discharge (Final Psychosocial Re-Evaluation):  Psychosocial Re-Evaluation - 02/16/24 0801       Psychosocial Re-Evaluation   Current issues with None Identified    Comments Patient reports no issues with their current mental states, sleep, stress, depression or anxiety. Will follow up with patient in  a few weeks for any changes.    Expected Outcomes Short: Continue to exercise regularly to support mental health and notify staff of any changes. Long: maintain mental health and well being through teaching of rehab or prescribed medications independently.    Interventions Encouraged to attend Cardiac Rehabilitation for the exercise    Continue Psychosocial Services  Follow up required by staff          Vocational Rehabilitation: Provide vocational rehab assistance to qualifying candidates.   Vocational Rehab Evaluation & Intervention:   Education: Education  Goals: Education classes will be provided on a variety of topics geared toward better understanding of heart health and risk factor modification. Participant will state understanding/return demonstration of topics presented as noted by education test scores.  Learning Barriers/Preferences:  Learning Barriers/Preferences - 11/11/23 1407       Learning Barriers/Preferences   Learning Barriers None    Learning Preferences None          General Cardiac Education Topics:  AED/CPR: - Group verbal and written instruction with the use of models to demonstrate the basic use of the AED with the basic ABC's of resuscitation.   Test and Procedures: - Group verbal and visual presentation and models provide information about basic cardiac anatomy and function. Reviews the testing methods done to diagnose heart disease and the outcomes of the test results. Describes the treatment choices: Medical Management, Angioplasty, or Coronary Bypass Surgery for treating various heart conditions including Myocardial Infarction, Angina, Valve Disease, and Cardiac Arrhythmias. Written material provided at class time.   Medication Safety: - Group verbal and visual instruction to review commonly prescribed medications for heart and lung disease. Reviews the medication, class of the drug, and side effects. Includes the steps to properly store meds and maintain the prescription regimen. Written material provided at class time.   Intimacy: - Group verbal instruction through game format to discuss how heart and lung disease can affect sexual intimacy. Written material provided at class time.   Know Your Numbers and Heart Failure: - Group verbal and visual instruction to discuss disease risk factors for cardiac and pulmonary disease and treatment options.  Reviews associated critical values for Overweight/Obesity, Hypertension, Cholesterol, and Diabetes.  Discusses basics of heart failure: signs/symptoms and  treatments.  Introduces Heart Failure Zone chart for action plan for heart failure. Written material provided at class time.   Infection Prevention: - Provides verbal and written material to individual with discussion of infection control including proper hand washing and proper equipment cleaning during exercise session. Flowsheet Row Cardiac Rehab from 11/17/2023 in [facility] Cardiac and Pulmonary Rehab  Date 11/17/23  Educator NT  Instruction Review Code 1- Verbalizes Understanding    Falls Prevention: - Provides verbal and written material to individual with discussion of falls prevention and safety. Flowsheet Row Cardiac Rehab from 11/17/2023 in [facility] Cardiac [facility] Pulmonary Rehab  Date 11/17/23  Educator NT  Instruction Review Code 1- Verbalizes Understanding    Other: -Provides group and verbal instruction on various topics (see comments)   Knowledge Questionnaire Score:  Knowledge Questionnaire Score - 11/17/23 0911       Knowledge Questionnaire Score   Pre Score 23/26          Core Components/Risk Factors/Patient Goals at Admission:  Personal Goals and Risk Factors at Admission - 11/11/23 1405       Core Components/Risk Factors/Patient Goals on Admission    Weight Management Yes;Weight Loss    Intervention Weight Management: Develop a combined  nutrition and exercise program designed to reach desired caloric intake, while maintaining appropriate intake of nutrient and fiber, sodium and fats, and appropriate energy expenditure required for the weight goal.;Weight Management: Provide education and appropriate resources to help participant work on and attain dietary goals.;Weight Management/Obesity: Establish reasonable short term and long term weight goals.    Expected Outcomes Short Term: Continue to assess and modify interventions until short term weight is achieved;Long Term: Adherence to nutrition and physical activity/exercise program aimed toward attainment of established  weight goal;Weight Loss: Understanding of general recommendations for a balanced deficit meal plan, which promotes 1-2 lb weight loss per week and includes a negative energy balance of [phone] kcal/d;Understanding recommendations for meals to include 15-35% energy as protein, 25-35% energy from fat, 35-60% energy from carbohydrates, less than 200mg  of dietary cholesterol, 20-35 gm of total fiber daily;Understanding of distribution of calorie intake throughout the day with the consumption of 4-5 meals/snacks    Hypertension Yes    Intervention Provide education on lifestyle modifcations including regular physical activity/exercise, weight management, moderate sodium restriction and increased consumption of fresh fruit, vegetables, and low fat dairy, alcohol moderation, and smoking cessation.;Monitor prescription use compliance.    Expected Outcomes Short Term: Continued assessment and intervention until BP is < 140/57mm HG in hypertensive participants. < 130/52mm HG in hypertensive participants with diabetes, heart failure or chronic kidney disease.;Long Term: Maintenance of blood pressure at goal levels.    Lipids Yes    Intervention Provide education and support for participant on nutrition & aerobic/resistive exercise along with prescribed medications to achieve LDL 70mg , HDL >40mg .    Expected Outcomes Short Term: Participant states understanding of desired cholesterol values and is compliant with medications prescribed. Participant is following exercise prescription and nutrition guidelines.;Long Term: Cholesterol controlled with medications as prescribed, with individualized exercise RX and with personalized nutrition plan. Value goals: LDL < 70mg , HDL > 40 mg.          Education:Diabetes - Individual verbal and written instruction to review signs/symptoms of diabetes, desired ranges of glucose level fasting, after meals and with exercise. Acknowledge that pre and post exercise glucose checks  will be done for 3 sessions at entry of program.   Core Components/Risk Factors/Patient Goals Review:   Goals and Risk Factor Review     Row Name 01/05/24 9191 01/24/24 0813 02/16/24 0802         Core Components/Risk Factors/Patient Goals Review   Personal Goals Review Hypertension Hypertension Weight Management/Obesity     Review Fitzroy reports he does not check his BP himself but says it is checked here at rehab. Encouraged him to look inot getting a cuff to be able to check his BP at home or when needed. Jaelyn does not check his BP at home, does not have cuff at home. He says he may or may not get one. Encouraged him to get one and start checking his BP at home. Karey wants to lose some weight and reach a weight goal 180 pounds. He is currently is 197 pounds. He wants to come off some of his medications and is seeing his Cardiologist at the end of March. He states that he might be able to come off Plavix .     Expected Outcomes STG: look into getting a BP cuff for home. LTG: Manage risk factors independently STG: look into getting a BP cuff for home. LTG: Manage risk factors independently Short: talk to Cardiologist about medication. Long: Come off Plavix .  Core Components/Risk Factors/Patient Goals at Discharge (Final Review):   Goals and Risk Factor Review - 02/16/24 0802       Core Components/Risk Factors/Patient Goals Review   Personal Goals Review Weight Management/Obesity    Review Rigdon wants to lose some weight and reach a weight goal 180 pounds. He is currently is 197 pounds. He wants to come off some of his medications and is seeing his Cardiologist at the end of March. He states that he might be able to come off Plavix .    Expected Outcomes Short: talk to Cardiologist about medication. Long: Come off Plavix .          ITP Comments:  ITP Comments     Row Name 11/11/23 1420 11/17/23 0908 11/22/23 0735 12/14/23 0849 01/11/24 1058   ITP Comments Initial phone call  completed. Diagnosis can be found in [facility] 6/23. EP Orientation scheduled for Thursday 7/31 at 8am. Completed and gym orientation for cardiac rehab. Initial ITP created and sent for review to Dr. Oneil Pinal, Medical Director. First full day of exercise!  Patient was oriented to gym and equipment including functions, settings, policies, and procedures.  Patient's individual exercise prescription and treatment plan were reviewed.  All starting workloads were established based on the results of the 6 minute walk test done at initial orientation visit.  The plan for exercise progression was also introduced and progression will be customized based on patient's performance and goals. 30 Day review completed. Medical Director ITP review done; changes made as directed and signed approval by Medical Director. New to program. 30 Day review completed. Medical Director ITP review done, changes made as directed, and signed approval by Medical Director.    Row Name 02/08/24 0944 03/07/24 1127         ITP Comments 30 Day review completed. Medical Director ITP review done, changes made as directed, and signed approval by Medical Director. 30 Day review completed. Medical Director ITP review done, changes made as directed, and signed approval by Medical Director.         Comments: 30 Day Review ITP

## 2024-03-08 ENCOUNTER — Encounter

## 2024-03-09 ENCOUNTER — Encounter: Admitting: *Deleted

## 2024-03-09 ENCOUNTER — Encounter

## 2024-03-09 VITALS — Ht 69.5 in | Wt 193.5 lb

## 2024-03-09 DIAGNOSIS — Z955 Presence of coronary angioplasty implant and graft: Secondary | ICD-10-CM | POA: Diagnosis not present

## 2024-03-09 NOTE — Progress Notes (Signed)
 Daily Session Note  Patient Details  Name: AKI ABALOS MRN: [id number] Date of Birth: 1955-05-15 Referring Provider:   Flowsheet Row Cardiac Rehab from 11/17/2023 in [facility] Card[facility] Re[facility]  Referring Provider Dr. Murray Khan, MD    Encounter Date: 03/09/2024  Check In:  Session Check In - 03/09/24 1106       Check-In   Supervising physician immediately available to respond to emergencies See telemetry face sheet for immediately available ER MD    Location [facility]-Cardiac & Pulmonary Rehab    Staff Present Fairy Plater RCP,RRT,BSRT;Cobie Leidner Tressa RN,BSN;Noah Tickle, BS, Exercise Physiologist;Maxon Conetta BS, Exercise Physiologist    Virtual Visit No    Medication changes reported     No    Fall or balance concerns reported    No    Warm-up and Cool-down Performed on first and last piece of equipment    Resistance Training Performed Yes    VAD Patient? No    PAD/SET Patient? No      Pain Assessment   Currently in Pain? No/denies             Social History   Tobacco Use  Smoking Status Never  Smokeless Tobacco Never    Goals Met:  Independence with exercise equipment Exercise tolerated well No report of concerns or symptoms today Strength training completed today  Goals Unmet:  Not Applicable  Comments: Pt able to follow exercise prescription today without complaint.  Will continue to monitor for progression.   6 Minute Walk     Row Name 11/17/23 0916 03/09/24 1116       6 Minute Walk   Phase Initial Discharge    Distance 1500 feet 1720 feet    Distance % Change -- 14.6 %    Distance Feet Change -- 220 ft    Walk Time 6 minutes 6 minutes    # of Rest Breaks 0 0    MPH 2.84 3.25    METS 3.37 3.8    RPE 11 9    Perceived Dyspnea  0 0    VO2 Peak 11.8 13.4    Symptoms No No    Resting HR 59 bpm 58 bpm    Resting BP 126/74 106/64    Resting Oxygen Saturation  96 % 98 %    Exercise Oxygen Saturation  during 6 min walk 94 % 98 %    Max  Ex. HR 98 bpm 97 bpm    Max Ex. BP 144/72 132/68    2 Minute Post BP 128/70 --        Dr. Oneil Pinal is Medical Director for [facility] Cardiac Rehabilitation.  Dr. Fuad Aleskerov is Medical Director for [facility] Pulmonary Rehabilitation.

## 2024-03-09 NOTE — Patient Instructions (Signed)
 Discharge Patient Instructions  Patient Details  Name: Randy Ingram MRN: [id number] Date of Birth: 06/12/1955 Referring Provider:  Trudy Dorn BRAVO, MD   Number of Visits: 51  Reason for Discharge:  Patient reached a stable level of exercise. Patient independent in their exercise. Patient has met program and personal goals.  Diagnosis:  Status post coronary artery stent placement  Initial Exercise Prescription:  Initial Exercise Prescription - 11/17/23 0900       Date of Initial Exercise RX and Referring Provider   Date 11/17/23    Referring Provider Dr. Murray Khan, MD      Oxygen   Maintain Oxygen Saturation 88% or higher      Treadmill   MPH 2.8    Grade 1    Minutes 15    METs 3.53      REL-XR   Level 3    Speed 50    Minutes 15    METs 3.37      Rower   Level 4    Watts 35    Minutes 15    METs 3.37      Prescription Details   Frequency (times per week) 3    Duration Progress to 30 minutes of continuous aerobic without signs/symptoms of physical distress      Intensity   THRR 40-80% of Max Heartrate 97-135    Ratings of Perceived Exertion 11-13    Perceived Dyspnea 0-4      Progression   Progression Continue to progress workloads to maintain intensity without signs/symptoms of physical distress.      Resistance Training   Training Prescription Yes    Weight 7 lb    Reps 10-15          Discharge Exercise Prescription (Final Exercise Prescription Changes):  Exercise Prescription Changes - 03/01/24 1000       Response to Exercise   Blood Pressure (Admit) 124/62    Blood Pressure (Exit) 100/60    Heart Rate (Admit) 67 bpm    Heart Rate (Exercise) 131 bpm    Heart Rate (Exit) 91 bpm    Rating of Perceived Exertion (Exercise) 17    Symptoms none    Duration Continue with 30 min of aerobic exercise without signs/symptoms of physical distress.    Intensity THRR unchanged      Progression   Progression Continue to progress  workloads to maintain intensity without signs/symptoms of physical distress.    Average METs 7.1      Resistance Training   Training Prescription Yes    Weight 7 lb    Reps 10-15      Interval Training   Interval Training No      Treadmill   MPH 3.2    Grade 12    Minutes 15    METs 8.74      REL-XR   Level 11    Minutes 15    METs 8.2      Rower   Level 10    Watts 76    Minutes 15    METs 6.39      Home Exercise Plan   Plans to continue exercise at [employer] (comment)   Plans to join [employer] for aerobic machines   Frequency Add 2 additional days to program exercise sessions.    Initial Home Exercises Provided 01/17/24      Oxygen   Maintain Oxygen Saturation 88% or higher  Functional Capacity:  6 Minute Walk     Row Name 11/17/23 0916 03/09/24 1116       6 Minute Walk   Phase Initial Discharge    Distance 1500 feet 1720 feet    Distance % Change -- 14.6 %    Distance Feet Change -- 220 ft    Walk Time 6 minutes 6 minutes    # of Rest Breaks 0 0    MPH 2.84 3.25    METS 3.37 3.8    RPE 11 9    Perceived Dyspnea  0 0    VO2 Peak 11.8 13.4    Symptoms No No    Resting HR 59 bpm 58 bpm    Resting BP 126/74 106/64    Resting Oxygen Saturation  96 % 98 %    Exercise Oxygen Saturation  during 6 min walk 94 % 98 %    Max Ex. HR 98 bpm 97 bpm    Max Ex. BP 144/72 132/68    2 Minute Post BP 128/70 --      Nutrition & Weight - Outcomes:  Pre Biometrics - 11/17/23 0918       Pre Biometrics   Height 5' 9.5 (1.765 m)    Weight 212 lb 1.6 oz (96.2 kg)    Waist Circumference 42 inches    Hip Circumference 40 inches    Waist to Hip Ratio 1.05 %    BMI (Calculated) 30.88    Single Leg Stand 30 seconds          Post Biometrics - 03/09/24 1118        Post  Biometrics   Height 5' 9.5 (1.765 m)    Weight 193 lb 8 oz (87.8 kg)    Waist Circumference 39.5 inches    Hip Circumference 37 inches    Waist to Hip Ratio 1.07 %     BMI (Calculated) 28.17    Single Leg Stand 30 seconds          Nutrition:  Nutrition Therapy & Goals - 11/17/23 0913       Nutrition Therapy   RD appointment deferred Yes      Intervention Plan   Intervention Prescribe, educate and counsel regarding individualized specific dietary modifications aiming towards targeted core components such as weight, hypertension, lipid management, diabetes, heart failure and other comorbidities.    Expected Outcomes Short Term Goal: Understand basic principles of dietary content, such as calories, fat, sodium, cholesterol and nutrients.;Short Term Goal: A plan has been developed with personal nutrition goals set during dietitian appointment.;Long Term Goal: Adherence to prescribed nutrition plan.         Goals reviewed with patient; copy given to patient.

## 2024-03-13 ENCOUNTER — Encounter

## 2024-03-13 ENCOUNTER — Telehealth: Payer: Self-pay

## 2024-03-13 NOTE — Telephone Encounter (Signed)
 Patient stated he will be in on Wednesday, 11/26, at 7:30

## 2024-03-14 ENCOUNTER — Encounter

## 2024-03-14 DIAGNOSIS — Z955 Presence of coronary angioplasty implant and graft: Secondary | ICD-10-CM

## 2024-03-14 NOTE — Progress Notes (Signed)
 Daily Session Note  Patient Details  Name: Randy Ingram MRN: [id number] Date of Birth: 1955/10/21 Referring Provider:   Flowsheet Row Cardiac Rehab from 11/17/2023 in [facility]  Referring Provider Dr. Murray Khan, MD    Encounter Date: 03/14/2024  Check In:  Session Check In - 03/14/24 0736       Check-In   Supervising physician immediately available to respond to emergencies See telemetry face sheet for immediately available ER MD    Location [facility]-Cardiac & Pulmonary Rehab    Staff Present Burnard Davenport RN,BSN,MPA;Maxon Conetta BS, Exercise Physiologist;Laura Cates RN,BSN;Margaret Best, MS, Exercise Physiologist    Virtual Visit No    Medication changes reported     No    Fall or balance concerns reported    No    Warm-up and Cool-down Performed on first and last piece of equipment    Resistance Training Performed Yes    VAD Patient? No    PAD/SET Patient? No      Pain Assessment   Currently in Pain? No/denies             Social History   Tobacco Use  Smoking Status Never  Smokeless Tobacco Never    Goals Met:  Independence with exercise equipment Exercise tolerated well No report of concerns or symptoms today Strength training completed today  Goals Unmet:  Not Applicable  Comments: Pt able to follow exercise prescription today without complaint.  Will continue to monitor for progression.    Dr. Oneil Pinal is Medical Director for [facility] Cardiac Rehabilitation.  Dr. Fuad Aleskerov is Medical Director for [facility] Pulmonary Rehabilitation.

## 2024-03-20 ENCOUNTER — Encounter: Attending: Internal Medicine

## 2024-03-20 DIAGNOSIS — Z955 Presence of coronary angioplasty implant and graft: Secondary | ICD-10-CM | POA: Diagnosis present

## 2024-03-20 NOTE — Progress Notes (Signed)
 Daily Session Note  Patient Details  Name: ALPHONSE ASBRIDGE MRN: [id number] Date of Birth: [birth year] Referring Provider:   Flowsheet Row Cardiac Rehab from 11/17/2023 in [facility]  Referring Provider Dr. Murray Khan, MD    Encounter Date: 03/20/2024  Check In:  Session Check In - 03/20/24 0739       Check-In   Supervising physician immediately available to respond to emergencies See telemetry face sheet for immediately available ER MD    Location [facility]Cardiac [facility] Pulmonary Re[facility]    Staff Present Burnard Davenport RN,BSN,MPA;Margaret Best, MS, Exercise Physiologist;Jason Elnor [facility]    Virtual Visit No    Medication changes reported     No    Fall or balance concerns reported    No    Warm-up and Cool-down Performed on first and last piece of equipment    Resistance Training Performed Yes    VAD Patient? No    PAD/SET Patient? No      Pain Assessment   Currently in Pain? No/denies             Social History   Tobacco Use  Smoking Status Never  Smokeless Tobacco Never    Goals Met:  Independence with exercise equipment Exercise tolerated well No report of concerns or symptoms today Strength training completed today  Goals Unmet:  Not Applicable  Comments: Pt able to follow exercise prescription today without complaint.  Will continue to monitor for progression.    Dr. Oneil Pinal is Medical Director for [facility] Cardiac Rehabilitation.  Dr. Fuad Aleskerov is Medical Director for [facility] Pulmonary Rehabilitation.

## 2024-03-22 ENCOUNTER — Encounter

## 2024-03-22 DIAGNOSIS — Z955 Presence of coronary angioplasty implant and graft: Secondary | ICD-10-CM

## 2024-03-22 NOTE — Progress Notes (Signed)
 Daily Session Note  Patient Details  Name: Randy Ingram MRN: [id number] Date of Birth: 20-Dec-1955 Referring Provider:   Flowsheet Row Cardiac Rehab from 11/17/2023 in [facility] Cardiac and Pulmonary Rehab  Referring Provider Dr. Murray Khan, MD    Encounter Date: 03/22/2024  Check In:  Session Check In - 03/22/24 0735       Check-In   Supervising physician immediately available to respond to emergencies See telemetry face sheet for immediately available ER MD    Location [facility]-Cardiac & Pulmonary Rehab    Staff Present Leita Franks RN,BSN;[facility] Delores, [place], RRT, CPFT;Jason Elnor RDN,LDN    Virtual Visit No    Medication changes reported     No    Fall or balance concerns reported    No    Warm-up and Cool-down Performed on first and last piece of equipment    Resistance Training Performed Yes    VAD Patient? No    PAD/SET Patient? No      Pain Assessment   Currently in Pain? No/denies             Social History   Tobacco Use  Smoking Status Never  Smokeless Tobacco Never    Goals Met:  Independence with exercise equipment Exercise tolerated well No report of concerns or symptoms today Strength training completed today  Goals Unmet:  Not Applicable  Comments: Pt able to follow exercise prescription today without complaint.  Will continue to monitor for progression.    Dr. Oneil Pinal is Medical Director for [facility] Cardiac Rehabilitation.  Dr. Fuad Aleskerov is Medical Director for [facility] Pulmonary Rehabilitation.

## 2024-03-23 ENCOUNTER — Encounter

## 2024-03-26 ENCOUNTER — Encounter

## 2024-03-26 DIAGNOSIS — Z955 Presence of coronary angioplasty implant and graft: Secondary | ICD-10-CM | POA: Diagnosis not present

## 2024-03-26 NOTE — Progress Notes (Signed)
 Daily Session Note  Patient Details  Name: Randy Ingram MRN: [id number] Date of Birth: 11/13/1955 Referring Provider:   Flowsheet Row Cardiac Rehab from 11/17/2023 in [facility] Cardiac and Pulmonary Rehab  Referring Provider Dr. Murray Khan, MD    Encounter Date: 03/26/2024  Check In:  Session Check In - 03/26/24 0802       Check-In   Supervising physician immediately available to respond to emergencies See telemetry face sheet for immediately available ER MD    Location [facility]-Cardiac & Pulmonary Rehab    Staff Present Burnard Davenport RN,BSN,MPA;[facility] Dyane BS, ACSM CEP, Exercise Physiologist;Jason Elnor RDN,LDN    Virtual Visit No    Medication changes reported     No    Fall or balance concerns reported    No    Warm-up and Cool-down Performed on first and last piece of equipment    Resistance Training Performed Yes    VAD Patient? No    PAD/SET Patient? No      Pain Assessment   Currently in Pain? No/denies             Social History   Tobacco Use  Smoking Status Never  Smokeless Tobacco Never    Goals Met:  Independence with exercise equipment Exercise tolerated well No report of concerns or symptoms today Strength training completed today  Goals Unmet:  Not Applicable  Comments: Pt able to follow exercise prescription today without complaint.  Will continue to monitor for progression.    Dr. Oneil Pinal is Medical Director for [facility] Cardiac Rehabilitation.  Dr. Fuad Aleskerov is Medical Director for [facility] Pulmonary Rehabilitation.

## 2024-03-27 ENCOUNTER — Encounter

## 2024-03-29 ENCOUNTER — Encounter: Admitting: Emergency Medicine

## 2024-03-29 DIAGNOSIS — Z955 Presence of coronary angioplasty implant and graft: Secondary | ICD-10-CM

## 2024-03-29 NOTE — Progress Notes (Signed)
 Cardiac Individual Treatment Plan  Patient Details  Name: Randy Ingram MRN: [id number] Date of Birth: [birth year] Referring Provider:   Flowsheet Row Cardiac Rehab from 11/17/2023 in [facility] Cardiac and Pulmonary Rehab  Referring Provider Dr. Murray Khan, MD    Initial Encounter Date:  Flowsheet Row Cardiac Rehab from 11/17/2023 in [facility] Cardiac and Pulmonary Rehab  Date 11/17/23    Visit Diagnosis: Status post coronary artery stent placement  Patient's Home Medications on Admission: Current Medications[1]  Past Medical History: Past Medical History:  Diagnosis Date   Hypertension    controlled on meds    Tobacco Use: Tobacco Use History[2]  Labs: Review Flowsheet  More data exists      Latest Ref Rng & Units 10/28/2016 11/09/2017 11/29/2018 01/01/2020 02/20/2021  Labs for ITP Cardiac and Pulmonary Rehab  Cholestrol 100 - 199 mg/dL 803  799  801  802  814   LDL (calc) 0 - 99 mg/dL 878  869  871  880  887   HDL-C >39 mg/dL 46  47  45  46  46   Trlycerides 0 - 149 mg/dL 856  883  876  818  845   Hemoglobin A1c 4.8 - 5.6 % - - - - 5.5      Exercise Target Goals: Exercise Program Goal: Individual exercise prescription set using results from initial 6 min walk test and THRR while considering  patients activity barriers and safety.   Exercise Prescription Goal: Initial exercise prescription builds to 30-45 minutes a day of aerobic activity, 2-3 days per week.  Home exercise guidelines will be given to patient during program as part of exercise prescription that the participant will acknowledge.   Education: Aerobic Exercise: - Group verbal and visual presentation on the components of exercise prescription. Introduces F.I.T.T principle from ACSM for exercise prescriptions.  Reviews F.I.T.T. principles of aerobic exercise including progression. Written material provided at class time. Flowsheet Row Cardiac Rehab from 11/17/2023 in [facility] Cardiac and Pulmonary Rehab  Education need  identified 11/17/23    Education: Resistance Exercise: - Group verbal and visual presentation on the components of exercise prescription. Introduces F.I.T.T principle from ACSM for exercise prescriptions  Reviews F.I.T.T. principles of resistance exercise including progression. Written material provided at class time.    Education: Exercise & Equipment Safety: - Individual verbal instruction and demonstration of equipment use and safety with use of the equipment. Flowsheet Row Cardiac Rehab from 11/17/2023 in [facility] Cardiac and Pulmonary Rehab  Date 11/17/23  Educator NT  Instruction Review Code 1- Verbalizes Understanding    Education: Exercise Physiology & General Exercise Guidelines: - Group verbal and written instruction with models to review the exercise physiology of the cardiovascular system and associated critical values. Provides general exercise guidelines with specific guidelines to those with heart or lung disease. Written material provided at class time.   Education: Flexibility, Balance, Mind/Body Relaxation: - Group verbal and visual presentation with interactive activity on the components of exercise prescription. Introduces F.I.T.T principle from ACSM for exercise prescriptions. Reviews F.I.T.T. principles of flexibility and balance exercise training including progression. Also discusses the mind body connection.  Reviews various relaxation techniques to help reduce and manage stress (i.e. Deep breathing, progressive muscle relaxation, and visualization). Balance handout provided to take home. Written material provided at class time.   Activity Barriers & Risk Stratification:  Activity Barriers & Cardiac Risk Stratification - 11/11/23 1402       Activity Barriers & Cardiac Risk Stratification   Activity Barriers  Other (comment)    Comments left foot plantar fascitis    Cardiac Risk Stratification Moderate          6 Minute Walk:  6 Minute Walk     Row Name  11/17/23 0916 03/09/24 1116       6 Minute Walk   Phase Initial Discharge    Distance 1500 feet 1720 feet    Distance % Change -- 14.6 %    Distance Feet Change -- 220 ft    Walk Time 6 minutes 6 minutes    # of Rest Breaks 0 0    MPH 2.84 3.25    METS 3.37 3.8    RPE 11 9    Perceived Dyspnea  0 0    VO2 Peak 11.8 13.4    Symptoms No No    Resting HR 59 bpm 58 bpm    Resting BP 126/74 106/64    Resting Oxygen Saturation  96 % 98 %    Exercise Oxygen Saturation  during 6 min walk 94 % 98 %    Max Ex. HR 98 bpm 97 bpm    Max Ex. BP 144/72 132/68    2 Minute Post BP 128/70 --       Oxygen Initial Assessment:   Oxygen Re-Evaluation:   Oxygen Discharge (Final Oxygen Re-Evaluation):   Initial Exercise Prescription:  Initial Exercise Prescription - 11/17/23 0900       Date of Initial Exercise RX and Referring Provider   Date 11/17/23    Referring Provider Dr. Murray Khan, MD      Oxygen   Maintain Oxygen Saturation 88% or higher      Treadmill   MPH 2.8    Grade 1    Minutes 15    METs 3.53      REL-XR   Level 3    Speed 50    Minutes 15    METs 3.37      Rower   Level 4    Watts 35    Minutes 15    METs 3.37      Prescription Details   Frequency (times per week) 3    Duration Progress to 30 minutes of continuous aerobic without signs/symptoms of physical distress      Intensity   THRR 40-80% of Max Heartrate 97-135    Ratings of Perceived Exertion 11-13    Perceived Dyspnea 0-4      Progression   Progression Continue to progress workloads to maintain intensity without signs/symptoms of physical distress.      Resistance Training   Training Prescription Yes    Weight 7 lb    Reps 10-15          Perform Capillary Blood Glucose checks as needed.  Exercise Prescription Changes:   Exercise Prescription Changes     Row Name 11/17/23 0900 12/08/23 0900 12/22/23 1600 01/17/24 0800 01/18/24 1000     Response to Exercise   Blood Pressure  (Admit) 126/74 118/62 126/62 -- 118/56   Blood Pressure (Exercise) 144/72 160/82 168/82 -- 150/70   Blood Pressure (Exit) 128/70 102/62 114/62 -- 114/64   Heart Rate (Admit) 59 bpm 60 bpm 66 bpm -- 63 bpm   Heart Rate (Exercise) 98 bpm 140 bpm 131 bpm -- 140 bpm   Heart Rate (Exit) 69 bpm 75 bpm 80 bpm -- 80 bpm   Oxygen Saturation (Admit) 96 % -- -- -- --   Oxygen Saturation (Exercise) 94 % -- -- -- --  Rating of Perceived Exertion (Exercise) 11 14 14  -- 14   Perceived Dyspnea (Exercise) 0 0 0 -- --   Symptoms none none none -- none   Comments Results first 2 weeks of exercise -- -- --   Duration -- Progress to 30 minutes of  aerobic without signs/symptoms of physical distress Progress to 30 minutes of  aerobic without signs/symptoms of physical distress -- Continue with 30 min of aerobic exercise without signs/symptoms of physical distress.   Intensity -- THRR unchanged THRR unchanged -- THRR unchanged     Progression   Progression -- Continue to progress workloads to maintain intensity without signs/symptoms of physical distress. Continue to progress workloads to maintain intensity without signs/symptoms of physical distress. -- Continue to progress workloads to maintain intensity without signs/symptoms of physical distress.   Average METs -- 4.72 5.46 -- 5.26     Resistance Training   Training Prescription -- Yes Yes -- Yes   Weight -- 7 lb 7 lb -- 7 lb   Reps -- 10-15 10-15 -- 10-15     Interval Training   Interval Training -- No No -- No     Treadmill   MPH -- 3.1 3.2 -- 3.2   Grade -- 2.5 3 -- 2.5   Minutes -- 15 15 -- 15   METs -- 4.44 4.77 -- 4.55     REL-XR   Level -- 10 8 -- 10   Minutes -- 15 15 -- 15   METs -- 7.8 7.5 -- 7.2     Rower   Level -- 10 10 -- 5   Watts -- 60 56 -- 71   Minutes -- 15 15 -- 15   METs -- 4.8 4.81 -- 6.11     Home Exercise Plan   Plans to continue exercise at -- -- -- [employer] (comment)  Plans to join [employer]  for aerobic machines [employer] (comment)  Plans to join [employer] for aerobic machines   Frequency -- -- -- Add 2 additional days to program exercise sessions. Add 2 additional days to program exercise sessions.   Initial Home Exercises Provided -- -- -- 01/17/24 01/17/24     Oxygen   Maintain Oxygen Saturation -- 88% or higher 88% or higher 88% or higher 88% or higher    Row Name 02/03/24 1000 02/16/24 0900 03/01/24 1000 03/13/24 1500 03/27/24 1400     Response to Exercise   Blood Pressure (Admit) 114/60 126/62 124/62 106/64 118/64   Blood Pressure (Exit) 120/72 108/60 100/60 104/60 112/60   Heart Rate (Admit) 65 bpm 61 bpm 67 bpm 60 bpm 64 bpm   Heart Rate (Exercise) 144 bpm 142 bpm 131 bpm 128 bpm 127 bpm   Heart Rate (Exit) 92 bpm 82 bpm 91 bpm 68 bpm 76 bpm   Rating of Perceived Exertion (Exercise) 16 17 17 18 14    Symptoms none none none none none   Duration Continue with 30 min of aerobic exercise without signs/symptoms of physical distress. Continue with 30 min of aerobic exercise without signs/symptoms of physical distress. Continue with 30 min of aerobic exercise without signs/symptoms of physical distress. Continue with 30 min of aerobic exercise without signs/symptoms of physical distress. Continue with 30 min of aerobic exercise without signs/symptoms of physical distress.   Intensity THRR unchanged THRR unchanged THRR unchanged THRR unchanged THRR unchanged     Progression   Progression Continue to progress workloads to maintain intensity without signs/symptoms of physical  distress. Continue to progress workloads to maintain intensity without signs/symptoms of physical distress. Continue to progress workloads to maintain intensity without signs/symptoms of physical distress. Continue to progress workloads to maintain intensity without signs/symptoms of physical distress. Continue to progress workloads to maintain intensity without signs/symptoms of physical distress.    Average METs 6.3 6.2 7.1 6.26 7.81     Resistance Training   Training Prescription Yes Yes Yes Yes Yes   Weight 7 lb 7 lb 7 lb 7 lb 7 lb   Reps 10-15 10-15 10-15 10-15 10-15     Interval Training   Interval Training No No No No No     Treadmill   MPH 3.2 3.2 3.2 3.2 3.2   Grade 3.5 6.5 12 11.5 12   Minutes 15 15 15 15 15    METs 4.99 6.32 8.74 8.52 8.74     Elliptical   Level -- -- -- 2 5   Speed -- -- -- 5 6   Minutes -- -- -- 15 15   METs -- -- -- -- 8.52     REL-XR   Level 10 9 11 8  --   Minutes 15 15 15 15  --   METs 8.1 6.8 8.2 6.6 --     Rower   Level 10 10 10  -- --   Watts 91 83 76 -- --   Minutes 15 15 15  -- --   METs 6.13 6.6 6.39 -- --     Home Exercise Plan   Plans to continue exercise at [employer] (comment)  Plans to join [employer] [facility] (comment)  Plans to join [employer] [facility] (comment)  Plans to join [employer] [facility] (comment)  Plans to join [employer] [facility] (comment)  Plans to join [employer] for aerobic machines   Frequency Add 2 additional days to program exercise sessions. Add 2 additional days to program exercise sessions. Add 2 additional days to program exercise sessions. Add 2 additional days to program exercise sessions. Add 2 additional days to program exercise sessions.   Initial Home Exercises Provided 01/17/24 01/17/24 01/17/24 01/17/24 01/17/24     Oxygen   Maintain Oxygen Saturation 88% or higher 88% or higher 88% or higher 88% or higher 88% or higher      Exercise Comments:   Exercise Comments     Row Name 11/22/23 0735           Exercise Comments First full day of exercise!  Patient was oriented to gym and equipment including functions, settings, policies, and procedures.  Patient's individual exercise prescription and treatment plan were reviewed.  All starting  workloads were established based on the results of the 6 minute walk test done at initial orientation visit.  The plan for exercise progression was also introduced and progression will be customized based on patient's performance and goals.          Exercise Goals and Review:   Exercise Goals     Row Name 11/17/23 0917             Exercise Goals   Increase Physical Activity Yes       Intervention Provide advice, education, support and counseling about physical activity/exercise needs.;Develop an individualized exercise prescription for aerobic and resistive training based on initial evaluation findings, risk stratification, comorbidities and participant's personal goals.       Expected Outcomes Short Term: Attend rehab  on a regular basis to increase amount of physical activity.;Long Term: Add in home exercise to make exercise part of routine and to increase amount of physical activity.;Long Term: Exercising regularly at least 3-5 days a week.       Increase Strength and Stamina Yes       Intervention Provide advice, education, support and counseling about physical activity/exercise needs.;Develop an individualized exercise prescription for aerobic and resistive training based on initial evaluation findings, risk stratification, comorbidities and participant's personal goals.       Expected Outcomes Long Term: Improve cardiorespiratory fitness, muscular endurance and strength as measured by increased METs and functional capacity ( );Short Term: Increase workloads from initial exercise prescription for resistance, speed, and METs.;Short Term: Perform resistance training exercises routinely during rehab and add in resistance training at home       Able to understand and use rate of perceived exertion (RPE) scale Yes       Intervention Provide education and explanation on how to use RPE scale       Expected Outcomes Short Term: Able to use RPE daily in rehab to express subjective intensity  level;Long Term:  Able to use RPE to guide intensity level when exercising independently       Able to understand and use Dyspnea scale Yes       Intervention Provide education and explanation on how to use Dyspnea scale       Expected Outcomes Short Term: Able to use Dyspnea scale daily in rehab to express subjective sense of shortness of breath during exertion;Long Term: Able to use Dyspnea scale to guide intensity level when exercising independently       Knowledge and understanding of Target Heart Rate Range (THRR) Yes       Intervention Provide education and explanation of THRR including how the numbers were predicted and where they are located for reference       Expected Outcomes Short Term: Able to state/look up THRR;Long Term: Able to use THRR to govern intensity when exercising independently;Short Term: Able to use daily as guideline for intensity in rehab       Able to check pulse independently Yes       Intervention Review the importance of being able to check your own pulse for safety during independent exercise;Provide education and demonstration on how to check pulse in carotid and radial arteries.       Expected Outcomes Short Term: Able to explain why pulse checking is important during independent exercise;Long Term: Able to check pulse independently and accurately       Understanding of Exercise Prescription Yes       Intervention Provide education, explanation, and written materials on patient's individual exercise prescription       Expected Outcomes Short Term: Able to explain program exercise prescription;Long Term: Able to explain home exercise prescription to exercise independently          Exercise Goals Re-Evaluation :  Exercise Goals Re-Evaluation     Row Name 11/22/23 0735 12/08/23 0936 12/22/23 1617 01/05/24 0800 01/05/24 1403     Exercise Goal Re-Evaluation   Exercise Goals Review Increase Physical Activity;Able to understand and use rate of perceived exertion  (RPE) scale;Knowledge and understanding of Target Heart Rate Range (THRR);Understanding of Exercise Prescription;Increase Strength and Stamina;Able to understand and use Dyspnea scale;Able to check pulse independently Increase Physical Activity;Increase Strength and Stamina;Understanding of Exercise Prescription Increase Physical Activity;Increase Strength and Stamina;Understanding of Exercise Prescription Increase Physical Activity;Increase Strength and Stamina;Understanding  of Exercise Prescription Increase Physical Activity;Increase Strength and Stamina;Understanding of Exercise Prescription   Comments Reviewed RPE and dyspnea scale, THR and program prescription with pt today.  Pt voiced understanding and was given a copy of goals to take home. Jovan is off to a good start in the program, and was able to attend his first few sessions during this review period. During these sessions he was able to increase his workload on the treadmill to a speed of 3. and an incline of 2.5%. He was also able to increase from level 3 to 10 on the XR. We will continue to monitor his progress in the program. Camron is doing well in rehab. He was recently able to increase his workload on the treadmill to a speed of 3.2mph and 3% incline. He was able to maintain level 10 on the rowing machine. We will continue to monitor his progress in the program. Ritter is doing well at rehab. He says he works hard at home. Not intentional exercise, but is active every day. Encouraged him to add more intentional exercise into his home routine Cole did not attend rehab during the last review period. He did return to the program on 01/03/2024. We will continue to monitor his progress in the program.   Expected Outcomes Short: Use RPE daily to regulate intensity. Long: Follow program prescription in THR. Short: Continue to follow exercise prescription. Long: Continue exercise to improve strength and stamina. Short: Continue to increase  treadmill workloads. Long: Continue exercise to improve strength and stamina. STg: Look to add more intentional exercise. LTG: Continue exercise to improve strength and stamina. Short: Return to consistent attendance in rehab. Long: Graduate.    Row Name 01/17/24 9193 01/18/24 1004 02/03/24 1040 02/16/24 0955 03/01/24 1052     Exercise Goal Re-Evaluation   Exercise Goals Review Increase Strength and Stamina;Understanding of Exercise Prescription;Able to understand and use Dyspnea scale;Increase Physical Activity;Knowledge and understanding of Target Heart Rate Range (THRR);Able to check pulse independently;Able to understand and use rate of perceived exertion (RPE) scale Increase Physical Activity;Increase Strength and Stamina;Understanding of Exercise Prescription Increase Physical Activity;Increase Strength and Stamina;Understanding of Exercise Prescription Increase Physical Activity;Increase Strength and Stamina;Understanding of Exercise Prescription Increase Physical Activity;Increase Strength and Stamina;Understanding of Exercise Prescription   Comments Reviewed home exercise with pt today.  Pt plans to join [employer] for aerobic machines and resistance training.  Reviewed THR, pulse, RPE, sign and symptoms, pulse oximetery and when to call 911 or MD.  Also discussed weather considerations and indoor options.  Pt voiced understanding. Kable continues to do well in the program. He continues to walk the treadmill at a speed of 3.2 mph with a decreased incline at 2.5%. He also improved back up to level 10 on the XR. We will continue to monitor his progress in the program. Philemon is doing well in rehab. He has been able to increase his treadmill incline from 2.5 to 3.5%, as well as increase from level 5 to 10 on the rowing machine. We will continue to monitor his progress in the program. Devaughn is doing well in rehab. He has been able to increase his treadmill workload by increasing his incline from 4.5  to 6.5% while maintaining his speed at 3.2 mph. He also continues to do well at level 10 on the rowing machine. We will continue to monitor his progress in the program. Maxim is doing well in rehab. He is due for his post soon and hopes to improve. He  increased his workload on the treadmill to a speed of 3.2 mph and incline of 12%. He also increased to level 11 on the XR. He worked at level 10 on the rower with an average watts of 76 watts. We will continue to monitor his progress in the program.   Expected Outcomes Short: Begin going to gym for exercise on days away from rehab. Long: Graduate and continue exercise independently. Short: Increase treadmill workload back up to previous incline. Long: Continue exercise to improve strength and stamina. Short: Continue to increase treadmill workload. Long: Continue exercise to improve strength and stamina. Short: Continue to progressively increase treadmill workload. Long: Continue exercise to improve strength and stamina. Short: Improve on post . Long: Continue to increase overall METs and stamina.    Row Name 03/13/24 1502 03/27/24 1450           Exercise Goal Re-Evaluation   Comments Bellamy is doing well in rehab and will graduate soon. He completed his post and improved by 14.6% with a total of 1720 feet. He tried the elliptical at level 2. He walked at a speed of 3.2 mph and incline of 11.5 on the treadmill. He worked at level 8 on the XR. We will continue to monitor his progress in the program until graduation. Vanna is doing well in rehab and will graduate in the next few sessions. He increased to level 5 on the elliptical. He increased his incline on the treadmill to 12% while maintaining a speed of 3.2 mph. We will continue to monitor his progress in the program until graduation.      Expected Outcomes Short: Graduate. Long: Continue to exercise independently. Short: Graduate. Long: Continue to exercise independently.          Discharge Exercise Prescription (Final Exercise Prescription Changes):  Exercise Prescription Changes - 03/27/24 1400       Response to Exercise   Blood Pressure (Admit) 118/64    Blood Pressure (Exit) 112/60    Heart Rate (Admit) 64 bpm    Heart Rate (Exercise) 127 bpm    Heart Rate (Exit) 76 bpm    Rating of Perceived Exertion (Exercise) 14    Symptoms none    Duration Continue with 30 min of aerobic exercise without signs/symptoms of physical distress.    Intensity THRR unchanged      Progression   Progression Continue to progress workloads to maintain intensity without signs/symptoms of physical distress.    Average METs 7.81      Resistance Training   Training Prescription Yes    Weight 7 lb    Reps 10-15      Interval Training   Interval Training No      Treadmill   MPH 3.2    Grade 12    Minutes 15    METs 8.74      Elliptical   Level 5    Speed 6    Minutes 15    METs 8.52      Home Exercise Plan   Plans to continue exercise at [facility] (comment)   Plans to join [employer] for aerobic machines   Frequency Add 2 additional days to program exercise sessions.    Initial Home Exercises Provided 01/17/24      Oxygen   Maintain Oxygen Saturation 88% or higher          Nutrition:  Target Goals: Understanding of nutrition guidelines, daily intake of sodium 1500mg , cholesterol 200mg , calories 30% from fat  and 7% or less from saturated fats, daily to have 5 or more servings of fruits and vegetables.  Education: Nutrition 1 -Group instruction provided by verbal, written material, interactive activities, discussions, models, and posters to present general guidelines for heart healthy nutrition including macronutrients, label reading, and promoting whole foods over processed counterparts. Education serves as pensions consultant of discussion of heart healthy eating for all. Written material provided at class time.    Education: Nutrition 2 -Group  instruction provided by verbal, written material, interactive activities, discussions, models, and posters to present general guidelines for heart healthy nutrition including sodium, cholesterol, and saturated fat. Providing guidance of habit forming to improve blood pressure, cholesterol, and body weight. Written material provided at class time.     Biometrics:  Pre Biometrics - 11/17/23 0918       Pre Biometrics   Height 5' 9.5 (1.765 m)    Weight 212 lb 1.6 oz (96.2 kg)    Waist Circumference 42 inches    Hip Circumference 40 inches    Waist to Hip Ratio 1.05 %    BMI (Calculated) 30.88    Single Leg Stand 30 seconds          Post Biometrics - 03/09/24 1118        Post  Biometrics   Height 5' 9.5 (1.765 m)    Weight 193 lb 8 oz (87.8 kg)    Waist Circumference 39.5 inches    Hip Circumference 37 inches    Waist to Hip Ratio 1.07 %    BMI (Calculated) 28.17    Single Leg Stand 30 seconds          Nutrition Therapy Plan and Nutrition Goals:  Nutrition Therapy & Goals - 11/17/23 0913       Nutrition Therapy   RD appointment deferred Yes      Intervention Plan   Intervention Prescribe, educate and counsel regarding individualized specific dietary modifications aiming towards targeted core components such as weight, hypertension, lipid management, diabetes, heart failure and other comorbidities.    Expected Outcomes Short Term Goal: Understand basic principles of dietary content, such as calories, fat, sodium, cholesterol and nutrients.;Short Term Goal: A plan has been developed with personal nutrition goals set during dietitian appointment.;Long Term Goal: Adherence to prescribed nutrition plan.          Nutrition Assessments:  MEDIFICTS Score Key: >=70 Need to make dietary changes  40-70 Heart Healthy Diet <= 40 Therapeutic Level Cholesterol Diet  Flowsheet Row Cardiac Rehab from 03/20/2024 in [facility] Cardiac and Pulmonary Rehab  Picture Your Plate Total  Score on Admission 67  Picture Your Plate Total Score on Discharge 62   Picture Your Plate Scores: <59 Unhealthy dietary pattern with much room for improvement. 41-50 Dietary pattern unlikely to meet recommendations for good health and room for improvement. 51-60 More healthful dietary pattern, with some room for improvement.  >60 Healthy dietary pattern, although there may be some specific behaviors that could be improved.    Nutrition Goals Re-Evaluation:  Nutrition Goals Re-Evaluation     Row Name 01/05/24 0805 01/24/24 9187 02/16/24 0800         Goals   Comment RD appointment deferred. (Spoke with Aerik about nutrition. He report he is eating eggs and bacon most mornings. He drinks whole milk and eats whole yogurt. Stays away from carbs. Discussed the high saturated fat nature of bacon and whole dairy products. Educated on the importance of whole grains and not to avoid quality  carbs. He says he has heard that before but is choosing to follow the advice of another cardiologist) RD appointment deferred. (Spoke with Ralpheal about nutrition. He report he is eating eggs and bacon most mornings. He drinks whole milk and eats whole yogurt. Stays away from carbs. Discussed the high saturated fat nature of bacon and whole dairy products. Educated on the importance of whole grains and not to avoid quality carbs. He says he has heard that before but is choosing to follow the advice of book he read by a surgeon) Deferred RD appointment        Nutrition Goals Discharge (Final Nutrition Goals Re-Evaluation):  Nutrition Goals Re-Evaluation - 02/16/24 0800       Goals   Comment Deferred RD appointment          Psychosocial: Target Goals: Acknowledge presence or absence of significant depression and/or stress, maximize coping skills, provide positive support system. Participant is able to verbalize types and ability to use techniques and skills needed for reducing stress and depression.    Education: Stress, Anxiety, and Depression - Group verbal and visual presentation to define topics covered.  Reviews how body is impacted by stress, anxiety, and depression.  Also discusses healthy ways to reduce stress and to treat/manage anxiety and depression. Written material provided at class time.   Education: Sleep Hygiene -Provides group verbal and written instruction about how sleep can affect your health.  Define sleep hygiene, discuss sleep cycles and impact of sleep habits. Review good sleep hygiene tips.   Initial Review & Psychosocial Screening:  Initial Psych Review & Screening - 11/11/23 1412       Initial Review   Current issues with None Identified      Family Dynamics   Good Support System? Yes   family     Barriers   Psychosocial barriers to participate in program There are no identifiable barriers or psychosocial needs.;The patient should benefit from training in stress management and relaxation.      Screening Interventions   Interventions To provide support and resources with identified psychosocial needs;Encouraged to exercise;Provide feedback about the scores to participant    Expected Outcomes Short Term goal: Utilizing psychosocial counselor, staff and physician to assist with identification of specific Stressors or current issues interfering with healing process. Setting desired goal for each stressor or current issue identified.;Long Term Goal: Stressors or current issues are controlled or eliminated.;Short Term goal: Identification and review with participant of any Quality of Life or Depression concerns found by scoring the questionnaire.;Long Term goal: The participant improves quality of Life and PHQ9 Scores as seen by post scores and/or verbalization of changes          Quality of Life Scores:   Quality of Life - 03/20/24 0746       Quality of Life Scores   Health/Function Pre 27.6 %    Health/Function Post 29.38 %    Health/Function % Change  6.45 %    Socioeconomic Pre 27.38 %    Socioeconomic Post 30 %    Socioeconomic % Change  9.57 %    Psych/Spiritual Pre 28.29 %    Psych/Spiritual Post 30 %    Psych/Spiritual % Change 6.04 %    Family Pre 30 %    Family Post 30 %    Family % Change 0 %    GLOBAL Pre 28.03 %    GLOBAL Post 30 %    GLOBAL % Change 7.03 %  Scores of 19 and below usually indicate a poorer quality of life in these areas.  A difference of  2-3 points is a clinically meaningful difference.  A difference of 2-3 points in the total score of the Quality of Life Index has been associated with significant improvement in overall quality of life, self-image, physical symptoms, and general health in studies assessing change in quality of life.  PHQ-9: Review Flowsheet  More data exists      03/20/2024 11/17/2023 08/18/2020 01/01/2020 11/27/2018  Depression screen PHQ 2/9  Decreased Interest 0 0 0 0 0  Down, Depressed, Hopeless 0 0 0 0 0  PHQ - 2 Score 0 0 0 0 0  Altered sleeping 0 0 0 0 -  Tired, decreased energy 0 0 0 0 -  Change in appetite 0 0 0 0 -  Feeling bad or failure about yourself  0 0 0 0 -  Trouble concentrating 0 0 0 0 -  Moving slowly or fidgety/restless 0 0 0 0 -  Suicidal thoughts 0 0 0 0 -  PHQ-9 Score 0 0  0  0  -  Difficult doing work/chores Not difficult at all - Not difficult at all Not difficult at all -    Details       Data saved with a previous flowsheet row definition        Interpretation of Total Score  Total Score Depression Severity:  1-4 = Minimal depression, 5-9 = Mild depression, 10-14 = Moderate depression, 15-19 = Moderately severe depression, 20-27 = Severe depression   Psychosocial Evaluation and Intervention:  Psychosocial Evaluation - 11/11/23 1417       Psychosocial Evaluation & Interventions   Interventions Encouraged to exercise with the program and follow exercise prescription    Comments Mr. Rasmusson is coming to cardiac rehab post stents. He  states he has been feeling well and already back to work. He states he has no stress concerns at this time and sleeps very well. He is looking forward to attending the program to learn more about heart healthy living    Expected Outcomes Short: attend cardiac rehab for education and exercise Long: develop and maintain positive self care habits    Continue Psychosocial Services  Follow up required by staff          Psychosocial Re-Evaluation:  Psychosocial Re-Evaluation     Row Name 01/05/24 0802 01/24/24 0810 02/16/24 0801         Psychosocial Re-Evaluation   Current issues with None Identified None Identified None Identified     Comments Fitz denies any anxiety, depression or stress. Reports he sleeps really well. When asked how much sleep he gets he says not enough. only 6-7hrs, but he says he sleep well when he is asleep. Dashon reports he sleeps well, 6-7hrs. he denies any anxiety, depression, and stress at this time. Patient reports no issues with their current mental states, sleep, stress, depression or anxiety. Will follow up with patient in a few weeks for any changes.     Expected Outcomes STG: Attend rehab more consisitently. LTG: Achieve and maintain a positive outlook on health and daily life STG: Attend rehab more consisitently. LTG: Achieve and maintain a positive outlook on health and daily life Short: Continue to exercise regularly to support mental health and notify staff of any changes. Long: maintain mental health and well being through teaching of rehab or prescribed medications independently.     Interventions Encouraged to attend Cardiac Rehabilitation  for the exercise Encouraged to attend Cardiac Rehabilitation for the exercise Encouraged to attend Cardiac Rehabilitation for the exercise     Continue Psychosocial Services  Follow up required by staff Follow up required by staff Follow up required by staff        Psychosocial Discharge (Final Psychosocial  Re-Evaluation):  Psychosocial Re-Evaluation - 02/16/24 0801       Psychosocial Re-Evaluation   Current issues with None Identified    Comments Patient reports no issues with their current mental states, sleep, stress, depression or anxiety. Will follow up with patient in a few weeks for any changes.    Expected Outcomes Short: Continue to exercise regularly to support mental health and notify staff of any changes. Long: maintain mental health and well being through teaching of rehab or prescribed medications independently.    Interventions Encouraged to attend Cardiac Rehabilitation for the exercise    Continue Psychosocial Services  Follow up required by staff          Vocational Rehabilitation: Provide vocational rehab assistance to qualifying candidates.   Vocational Rehab Evaluation & Intervention:   Education: Education Goals: Education classes will be provided on a variety of topics geared toward better understanding of heart health and risk factor modification. Participant will state understanding/return demonstration of topics presented as noted by education test scores.  Learning Barriers/Preferences:  Learning Barriers/Preferences - 11/11/23 1407       Learning Barriers/Preferences   Learning Barriers None    Learning Preferences None          General Cardiac Education Topics:  AED/CPR: - Group verbal and written instruction with the use of models to demonstrate the basic use of the AED with the basic ABC's of resuscitation.   Test and Procedures: - Group verbal and visual presentation and models provide information about basic cardiac anatomy and function. Reviews the testing methods done to diagnose heart disease and the outcomes of the test results. Describes the treatment choices: Medical Management, Angioplasty, or Coronary Bypass Surgery for treating various heart conditions including Myocardial Infarction, Angina, Valve Disease, and Cardiac Arrhythmias.  Written material provided at class time.   Medication Safety: - Group verbal and visual instruction to review commonly prescribed medications for heart and lung disease. Reviews the medication, class of the drug, and side effects. Includes the steps to properly store meds and maintain the prescription regimen. Written material provided at class time.   Intimacy: - Group verbal instruction through game format to discuss how heart and lung disease can affect sexual intimacy. Written material provided at class time.   Know Your Numbers and Heart Failure: - Group verbal and visual instruction to discuss disease risk factors for cardiac and pulmonary disease and treatment options.  Reviews associated critical values for Overweight/Obesity, Hypertension, Cholesterol, and Diabetes.  Discusses basics of heart failure: signs/symptoms and treatments.  Introduces Heart Failure Zone chart for action plan for heart failure. Written material provided at class time.   Infection Prevention: - Provides verbal and written material to individual with discussion of infection control including proper hand washing and proper equipment cleaning during exercise session. Flowsheet Row Cardiac Rehab from 11/17/2023 in [facility] Cardiac and Pulmonary Rehab  Date 11/17/23  Educator NT  Instruction Review Code 1- Verbalizes Understanding    Falls Prevention: - Provides verbal and written material to individual with discussion of falls prevention and safety. Flowsheet Row Cardiac Rehab from 11/17/2023 in [facility] Cardiac and Pulmonary Rehab  Date 11/17/23  Educator NT  Instruction  Review Code 1- Verbalizes Understanding    Other: -Provides group and verbal instruction on various topics (see comments)   Knowledge Questionnaire Score:  Knowledge Questionnaire Score - 03/20/24 0746       Knowledge Questionnaire Score   Pre Score 23/26    Post Score 24/26          Core Components/Risk Factors/Patient Goals at  Admission:  Personal Goals and Risk Factors at Admission - 11/11/23 1405       Core Components/Risk Factors/Patient Goals on Admission    Weight Management Yes;Weight Loss    Intervention Weight Management: Develop a combined nutrition and exercise program designed to reach desired caloric intake, while maintaining appropriate intake of nutrient and fiber, sodium and fats, and appropriate energy expenditure required for the weight goal.;Weight Management: Provide education and appropriate resources to help participant work on and attain dietary goals.;Weight Management/Obesity: Establish reasonable short term and long term weight goals.    Expected Outcomes Short Term: Continue to assess and modify interventions until short term weight is achieved;Long Term: Adherence to nutrition and physical activity/exercise program aimed toward attainment of established weight goal;Weight Loss: Understanding of general recommendations for a balanced deficit meal plan, which promotes 1-2 lb weight loss per week and includes a negative energy balance of [phone] kcal/d;Understanding recommendations for meals to include 15-35% energy as protein, 25-35% energy from fat, 35-60% energy from carbohydrates, less than 200mg  of dietary cholesterol, 20-35 gm of total fiber daily;Understanding of distribution of calorie intake throughout the day with the consumption of 4-5 meals/snacks    Hypertension Yes    Intervention Provide education on lifestyle modifcations including regular physical activity/exercise, weight management, moderate sodium restriction and increased consumption of fresh fruit, vegetables, and low fat dairy, alcohol moderation, and smoking cessation.;Monitor prescription use compliance.    Expected Outcomes Short Term: Continued assessment and intervention until BP is < 140/67mm HG in hypertensive participants. < 130/55mm HG in hypertensive participants with diabetes, heart failure or chronic kidney  disease.;Long Term: Maintenance of blood pressure at goal levels.    Lipids Yes    Intervention Provide education and support for participant on nutrition & aerobic/resistive exercise along with prescribed medications to achieve LDL 70mg , HDL >40mg .    Expected Outcomes Short Term: Participant states understanding of desired cholesterol values and is compliant with medications prescribed. Participant is following exercise prescription and nutrition guidelines.;Long Term: Cholesterol controlled with medications as prescribed, with individualized exercise RX and with personalized nutrition plan. Value goals: LDL < 70mg , HDL > 40 mg.          Education:Diabetes - Individual verbal and written instruction to review signs/symptoms of diabetes, desired ranges of glucose level fasting, after meals and with exercise. Acknowledge that pre and post exercise glucose checks will be done for 3 sessions at entry of program.   Core Components/Risk Factors/Patient Goals Review:   Goals and Risk Factor Review     Row Name 01/05/24 9191 01/24/24 0813 02/16/24 0802         Core Components/Risk Factors/Patient Goals Review   Personal Goals Review Hypertension Hypertension Weight Management/Obesity     Review Avraham reports he does not check his BP himself but says it is checked here at rehab. Encouraged him to look inot getting a cuff to be able to check his BP at home or when needed. Demontez does not check his BP at home, does not have cuff at home. He says he may or may not get one. Encouraged him to  get one and start checking his BP at home. Davidson wants to lose some weight and reach a weight goal 180 pounds. He is currently is 197 pounds. He wants to come off some of his medications and is seeing his Cardiologist at the end of March. He states that he might be able to come off Plavix .     Expected Outcomes STG: look into getting a BP cuff for home. LTG: Manage risk factors independently STG: look into getting  a BP cuff for home. LTG: Manage risk factors independently Short: talk to Cardiologist about medication. Long: Come off Plavix .        Core Components/Risk Factors/Patient Goals at Discharge (Final Review):   Goals and Risk Factor Review - 02/16/24 0802       Core Components/Risk Factors/Patient Goals Review   Personal Goals Review Weight Management/Obesity    Review Paden wants to lose some weight and reach a weight goal 180 pounds. He is currently is 197 pounds. He wants to come off some of his medications and is seeing his Cardiologist at the end of March. He states that he might be able to come off Plavix .    Expected Outcomes Short: talk to Cardiologist about medication. Long: Come off Plavix .          ITP Comments:  ITP Comments     Row Name 11/11/23 1420 11/17/23 0908 11/22/23 0735 12/14/23 0849 01/11/24 1058   ITP Comments Initial phone call completed. Diagnosis can be found in [facility] 6/23. EP Orientation scheduled for Thursday 7/31 at 8am. Completed and gym orientation for cardiac rehab. Initial ITP created and sent for review to Dr. Oneil Pinal, Medical Director. First full day of exercise!  Patient was oriented to gym and equipment including functions, settings, policies, and procedures.  Patient's individual exercise prescription and treatment plan were reviewed.  All starting workloads were established based on the results of the 6 minute walk test done at initial orientation visit.  The plan for exercise progression was also introduced and progression will be customized based on patient's performance and goals. 30 Day review completed. Medical Director ITP review done; changes made as directed and signed approval by Medical Director. New to program. 30 Day review completed. Medical Director ITP review done, changes made as directed, and signed approval by Medical Director.    Row Name 02/08/24 0944 03/07/24 1127 03/29/24 0747       ITP Comments 30 Day review completed.  Medical Director ITP review done, changes made as directed, and signed approval by Medical Director. 30 Day review completed. Medical Director ITP review done, changes made as directed, and signed approval by Medical Director. Treylon graduated today from  rehab with 36 sessions completed.  Details of the patient's exercise prescription and what He needs to do in order to continue the prescription and progress were discussed with patient.  Patient was given a copy of prescription and goals.  Patient verbalized understanding. Esco plans to continue to exercise by [employer].        Comments: Discharge ITP    [1]  Current Outpatient Medications:    amLODipine  (NORVASC ) 10 MG tablet, Take 1 tablet (10 mg total) by mouth daily., Disp: 90 tablet, Rfl: 3   aspirin  EC 81 MG tablet, Take 81 mg by mouth in the morning. Swallow whole., Disp: , Rfl:    B COMPLEX VITAMINS PO, Take 1 tablet by mouth in the morning., Disp: , Rfl:    chlorthalidone  (HYGROTON ) 50 MG  tablet, Take 50 mg by mouth in the morning., Disp: , Rfl:    cholecalciferol (VITAMIN D3) 25 MCG (1000 UNIT) tablet, Take 1,000 Units by mouth in the morning., Disp: , Rfl:    clopidogrel  (PLAVIX ) 75 MG tablet, Take 1 tablet (75 mg total) by mouth daily., Disp: 60 tablet, Rfl: 0   clopidogrel  (PLAVIX ) 75 MG tablet, Take 1 tablet (75 mg total) by mouth daily., Disp: 60 tablet, Rfl: 0   isosorbide  mononitrate (IMDUR ) 30 MG 24 hr tablet, Take 1 tablet (30 mg total) by mouth daily., Disp: 30 tablet, Rfl: 0   isosorbide  mononitrate (IMDUR ) 60 MG 24 hr tablet, Take 60 mg by mouth daily., Disp: , Rfl:    Krill Oil 1000 MG CAPS, Take 1,000 mg by mouth in the morning., Disp: , Rfl:    losartan  (COZAAR ) 100 MG tablet, Take 1 tablet (100 mg total) by mouth daily., Disp: 90 tablet, Rfl: 0   metoprolol succinate (TOPROL-XL) 25 MG 24 hr tablet, Take 25 mg by mouth in the morning., Disp: , Rfl:    OVER THE COUNTER MEDICATION, Take 1 Scoop by mouth in the  morning. Oligomeric proanthocyanidins (OPC), Disp: , Rfl:    Potassium 99 MG TABS, Take 99 mg by mouth in the morning., Disp: , Rfl:    Resveratrol 100 MG CAPS, Take 100 mg by mouth in the morning., Disp: , Rfl:    rosuvastatin  (CRESTOR ) 40 MG tablet, Take 40 mg by mouth every evening., Disp: , Rfl:  [2]  Social History Tobacco Use  Smoking Status Never  Smokeless Tobacco Never

## 2024-03-29 NOTE — Progress Notes (Signed)
 Discharge Summary   Randy Ingram  May 05, 1955   [name] graduated today from  rehab with 36 sessions completed.  Details of the patient's exercise prescription and what He needs to do in order to continue the prescription and progress were discussed with patient.  Patient was given a copy of prescription and goals.  Patient verbalized understanding. [name] plans to continue to exercise by [employer].   6 Minute Walk     Row Name 11/17/23 0916 03/09/24 1116       6 Minute Walk   Phase Initial Discharge    Distance 1500 feet 1720 feet    Distance % Change -- 14.6 %    Distance Feet Change -- 220 ft    Walk Time 6 minutes 6 minutes    # of Rest Breaks 0 0    MPH 2.84 3.25    METS 3.37 3.8    RPE 11 9    Perceived Dyspnea  0 0    VO2 Peak 11.8 13.4    Symptoms No No    Resting HR 59 bpm 58 bpm    Resting BP 126/74 106/64    Resting Oxygen Saturation  96 % 98 %    Exercise Oxygen Saturation  during 6 min walk 94 % 98 %    Max Ex. HR 98 bpm 97 bpm    Max Ex. BP 144/72 132/68    2 Minute Post BP 128/70 --

## 2024-03-29 NOTE — Progress Notes (Signed)
 Daily Session Note  Patient Details  Name: Randy Ingram MRN: [id number] Date of Birth: [birth year] Referring Provider:   Flowsheet Row Cardiac Rehab [facility]/17/[facility] Cardiac [facility] Pul[facility] Rehab  Referring Provider Dr. Murray Khan, MD    Encounter Date: 03/29/2024  Check In:  Session Check In - 03/29/24 0745       Check-In   Supervising physician immediately available to respond to emergencies See telemetry face sheet for immediately available ER MD    Location [facility]-Cardiac & Pulmonary Rehab    Staff Present Leita Franks RN,BSN;Joseph Rolinda RCP,RRT,BSRT;Kristen Coble RN,BC,MSN    Virtual Visit No    Medication changes reported     No    Fall or balance concerns reported    No    Tobacco Cessation No Change    Warm-up and Cool-down Performed on first and last piece of equipment    Resistance Training Performed Yes    VAD Patient? No    PAD/SET Patient? No      Pain Assessment   Currently in Pain? No/denies             Tobacco Use History[1]  Goals Met:  Independence with exercise equipment Exercise tolerated well No report of concerns or symptoms today Strength training completed today  Goals Unmet:  Not Applicable  Comments: Randy Ingram graduated today from  rehab with 36 sessions completed.  Details of the patient's exercise prescription and what He needs to do in order to continue the prescription and progress were discussed with patient.  Patient was given a copy of prescription and goals.  Patient verbalized understanding. Randy Ingram plans to continue to exercise by [employer].     Dr. Oneil Pinal is Medical Director for [facility] Cardiac Rehabilitation.  Dr. Fuad Aleskerov is Medical Director for [facility] Pulmonary Rehabilitation.     [1]  Social History Tobacco Use  Smoking Status Never  Smokeless Tobacco Never
